# Patient Record
Sex: Female | Born: 1980 | Race: White | Hispanic: No | Marital: Married | Smoking: Never smoker
Health system: Southern US, Community
[De-identification: ages and names within clinical notes are randomized; demographics above are authoritative.]

## PROBLEM LIST (undated history)

## (undated) ENCOUNTER — Emergency Department (HOSPITAL_COMMUNITY): Admission: EM | Payer: Self-pay | Source: Home / Self Care

## (undated) ENCOUNTER — Inpatient Hospital Stay: Payer: Self-pay

## (undated) DIAGNOSIS — Q379 Unspecified cleft palate with unilateral cleft lip: Secondary | ICD-10-CM

## (undated) DIAGNOSIS — Z803 Family history of malignant neoplasm of breast: Secondary | ICD-10-CM

## (undated) DIAGNOSIS — R310 Gross hematuria: Secondary | ICD-10-CM

## (undated) DIAGNOSIS — D649 Anemia, unspecified: Secondary | ICD-10-CM

## (undated) DIAGNOSIS — N2889 Other specified disorders of kidney and ureter: Secondary | ICD-10-CM

## (undated) DIAGNOSIS — N3001 Acute cystitis with hematuria: Secondary | ICD-10-CM

## (undated) DIAGNOSIS — IMO0002 Reserved for concepts with insufficient information to code with codable children: Secondary | ICD-10-CM

## (undated) DIAGNOSIS — N1330 Unspecified hydronephrosis: Secondary | ICD-10-CM

## (undated) HISTORY — DX: Unspecified hydronephrosis: N13.30

## (undated) HISTORY — PX: KIDNEY SURGERY: SHX687

## (undated) HISTORY — DX: Gross hematuria: R31.0

## (undated) HISTORY — DX: Family history of malignant neoplasm of breast: Z80.3

## (undated) HISTORY — DX: Other specified disorders of kidney and ureter: N28.89

## (undated) HISTORY — DX: Reserved for concepts with insufficient information to code with codable children: IMO0002

## (undated) HISTORY — PX: CLEFT LIP REPAIR: SUR1164

## (undated) HISTORY — DX: Unspecified cleft palate with unilateral cleft lip: Q37.9

## (undated) HISTORY — PX: TUBAL LIGATION: SHX77

## (undated) HISTORY — DX: Acute cystitis with hematuria: N30.01

## (undated) HISTORY — PX: ECTOPIC PREGNANCY SURGERY: SHX613

---
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
## 2003-10-14 ENCOUNTER — Other Ambulatory Visit: Admission: RE | Admit: 2003-10-14 | Discharge: 2003-10-14 | Payer: Self-pay | Admitting: Obstetrics and Gynecology

## 2003-11-25 ENCOUNTER — Ambulatory Visit (HOSPITAL_COMMUNITY): Admission: RE | Admit: 2003-11-25 | Discharge: 2003-11-25 | Payer: Self-pay | Admitting: Obstetrics and Gynecology

## 2004-01-25 ENCOUNTER — Ambulatory Visit (HOSPITAL_COMMUNITY): Admission: RE | Admit: 2004-01-25 | Discharge: 2004-01-25 | Payer: Self-pay | Admitting: Obstetrics and Gynecology

## 2004-03-21 ENCOUNTER — Inpatient Hospital Stay (HOSPITAL_COMMUNITY): Admission: AD | Admit: 2004-03-21 | Discharge: 2004-03-21 | Payer: Self-pay | Admitting: Obstetrics and Gynecology

## 2004-04-25 ENCOUNTER — Inpatient Hospital Stay (HOSPITAL_COMMUNITY): Admission: AD | Admit: 2004-04-25 | Discharge: 2004-04-27 | Payer: Self-pay | Admitting: Internal Medicine

## 2010-06-12 ENCOUNTER — Emergency Department: Payer: Self-pay | Admitting: Emergency Medicine

## 2011-03-18 ENCOUNTER — Ambulatory Visit: Payer: Self-pay | Admitting: Obstetrics and Gynecology

## 2011-03-21 LAB — PATHOLOGY REPORT

## 2011-07-05 ENCOUNTER — Encounter: Payer: Self-pay | Admitting: Obstetrics & Gynecology

## 2011-11-15 ENCOUNTER — Observation Stay: Payer: Self-pay | Admitting: Obstetrics and Gynecology

## 2011-11-19 ENCOUNTER — Observation Stay: Payer: Self-pay | Admitting: Obstetrics and Gynecology

## 2011-11-20 ENCOUNTER — Observation Stay: Payer: Self-pay | Admitting: Obstetrics and Gynecology

## 2011-12-05 ENCOUNTER — Observation Stay: Payer: Self-pay | Admitting: Obstetrics and Gynecology

## 2011-12-10 ENCOUNTER — Observation Stay: Payer: Self-pay

## 2011-12-17 ENCOUNTER — Observation Stay: Payer: Self-pay

## 2011-12-19 ENCOUNTER — Inpatient Hospital Stay: Payer: Self-pay | Admitting: Obstetrics and Gynecology

## 2012-05-16 ENCOUNTER — Emergency Department: Payer: Self-pay | Admitting: *Deleted

## 2013-03-30 ENCOUNTER — Encounter: Payer: Self-pay | Admitting: Family Medicine

## 2013-04-23 ENCOUNTER — Encounter: Payer: Self-pay | Admitting: Family Medicine

## 2013-10-22 ENCOUNTER — Ambulatory Visit: Payer: Self-pay | Admitting: Otolaryngology

## 2014-09-21 ENCOUNTER — Emergency Department: Payer: Self-pay | Admitting: Emergency Medicine

## 2014-09-21 LAB — URINALYSIS, COMPLETE
Bacteria: NONE SEEN
Bilirubin,UR: NEGATIVE
Blood: NEGATIVE
Glucose,UR: NEGATIVE mg/dL (ref 0–75)
Hyaline Cast: 2
Ketone: NEGATIVE
Leukocyte Esterase: NEGATIVE
Nitrite: NEGATIVE
Ph: 8 (ref 4.5–8.0)
Protein: NEGATIVE
RBC,UR: NONE SEEN /HPF (ref 0–5)
Specific Gravity: 1.015 (ref 1.003–1.030)
Squamous Epithelial: 1
WBC UR: NONE SEEN /HPF (ref 0–5)

## 2014-09-21 LAB — HCG, QUANTITATIVE, PREGNANCY: Beta Hcg, Quant.: 31752 m[IU]/mL — ABNORMAL HIGH

## 2014-09-21 LAB — COMPREHENSIVE METABOLIC PANEL
Albumin: 3.5 g/dL (ref 3.4–5.0)
Alkaline Phosphatase: 74 U/L
Anion Gap: 7 (ref 7–16)
BUN: 9 mg/dL (ref 7–18)
Bilirubin,Total: 0.2 mg/dL (ref 0.2–1.0)
Calcium, Total: 8.5 mg/dL (ref 8.5–10.1)
Chloride: 106 mmol/L (ref 98–107)
Co2: 24 mmol/L (ref 21–32)
Creatinine: 0.77 mg/dL (ref 0.60–1.30)
EGFR (African American): >60
EGFR (Non-African Amer.): >60
Glucose: 86 mg/dL (ref 65–99)
Osmolality: 272 (ref 275–301)
Potassium: 3.8 mmol/L (ref 3.5–5.1)
SGOT(AST): 13 U/L — ABNORMAL LOW (ref 15–37)
SGPT (ALT): 17 U/L
Sodium: 137 mmol/L (ref 136–145)
Total Protein: 7.3 g/dL (ref 6.4–8.2)

## 2014-09-21 LAB — CBC
HCT: 36.4 % (ref 35.0–47.0)
HGB: 11.7 g/dL — ABNORMAL LOW (ref 12.0–16.0)
MCH: 26.5 pg (ref 26.0–34.0)
MCHC: 32.2 g/dL (ref 32.0–36.0)
MCV: 82 fL (ref 80–100)
Platelet: 198 10*3/uL (ref 150–440)
RBC: 4.42 10*6/uL (ref 3.80–5.20)
RDW: 16.5 % — ABNORMAL HIGH (ref 11.5–14.5)
WBC: 6.2 10*3/uL (ref 3.6–11.0)

## 2014-09-21 LAB — LIPASE, BLOOD: Lipase: 89 U/L (ref 73–393)

## 2014-10-27 LAB — OB RESULTS CONSOLE ANTIBODY SCREEN: Antibody Screen: NEGATIVE

## 2014-10-27 LAB — OB RESULTS CONSOLE RPR: RPR: NONREACTIVE

## 2014-10-27 LAB — OB RESULTS CONSOLE HIV ANTIBODY (ROUTINE TESTING): HIV: NONREACTIVE

## 2014-10-27 LAB — OB RESULTS CONSOLE ABO/RH: RH Type: POSITIVE

## 2014-10-27 LAB — OB RESULTS CONSOLE PLATELET COUNT: Platelets: 223 10*3/uL

## 2014-10-27 LAB — OB RESULTS CONSOLE VARICELLA ZOSTER ANTIBODY, IGG: Varicella: IMMUNE

## 2014-10-27 LAB — OB RESULTS CONSOLE RUBELLA ANTIBODY, IGM: Rubella: NON-IMMUNE/NOT IMMUNE

## 2014-10-27 LAB — OB RESULTS CONSOLE GC/CHLAMYDIA
Chlamydia: NEGATIVE
Gonorrhea: NEGATIVE

## 2014-10-27 LAB — OB RESULTS CONSOLE HGB/HCT, BLOOD
HCT: 35 %
Hemoglobin: 11.7 g/dL

## 2014-11-06 LAB — OB RESULTS CONSOLE GC/CHLAMYDIA
Chlamydia: NEGATIVE
Gonorrhea: NEGATIVE

## 2014-11-06 LAB — OB RESULTS CONSOLE HIV ANTIBODY (ROUTINE TESTING): HIV: NONREACTIVE

## 2014-11-06 LAB — OB RESULTS CONSOLE HEPATITIS B SURFACE ANTIGEN: Hepatitis B Surface Ag: NEGATIVE

## 2014-12-24 NOTE — L&D Delivery Note (Signed)
Obstetrical Delivery Note   Date of Delivery:   05/12/2015 Primary OB:   [facility] Gestational Age/EDD: [redacted]w[redacted]d  Antepartum complications: History of IUFD, right hydronephrosis  Delivered By:   Durene Romans MD  Delivery Type:   spontaneous vaginal delivery  Procedure Details:   CTSP and over one UC, patient delivered vigorous female infant, with nuchal cord x 1 reduced. Delayed CC and cord then cut. Placenta out (intact) via active third stage. Laceration repaired after injection of 1% lidocaine (90m) with 2-0 vicryl in the usual fashion Anesthesia:    epidural that wasn't working Intrapartum complications: None GBS:    Neg Laceration:    2nd without capsular involvement Episiotomy:    none Placenta:    Via active 3rd stage. To pathology: no Estimated Blood Loss:  262mL  Baby:    Liveborn female, Apgars 8/9, weight 3350gm  Durene Romans. MD [facility] Pager [phone]

## 2015-02-16 ENCOUNTER — Observation Stay: Payer: Self-pay | Admitting: Obstetrics and Gynecology

## 2015-03-03 ENCOUNTER — Ambulatory Visit: Payer: Self-pay | Admitting: Urology

## 2015-03-07 ENCOUNTER — Observation Stay: Payer: Self-pay

## 2015-03-08 ENCOUNTER — Emergency Department: Payer: Self-pay | Admitting: Obstetrics and Gynecology

## 2015-04-21 ENCOUNTER — Observation Stay
Admit: 2015-04-21 | Disposition: A | Discharge status: Home / Self Care | Payer: Self-pay | Attending: Obstetrics and Gynecology | Admitting: Obstetrics and Gynecology

## 2015-04-24 NOTE — Op Note (Signed)
   DATE OF PROCEDURE:   03/07/2015  PREOPERATIVE DIAGNOSIS: Right flank pain with hydronephrosis.   POSTOPERATIVE DIAGNOSIS:  Right flank pain with hydronephrosis with no evidence of calculi, but some tortuosity and dilatation of the right ureter which was corrected with the ureteroscopy.   PROCEDURE:  Cystoscopy, right ureteroscopy with a very short limited pyelogram.   DESCRIPTION OF PROCEDURE: With the patient sterilely prepped and draped and in the supine lithotomy position for ease of approach to the external genitalia, I began the procedure. Cystoscopy is done. No tumors, masses, growths, or stones in the bladder.  The right side ureter is instrumented with a 0.036 sensor wire.  I then took a rigid ureteroscope 7 [place] and placed it up the ureter cephalad direction without difficulty next to the wire.  There is no stone up to the UPJ, but there is tortuosity and the wire does not completely go into the renal pelvis.  So, I then decided to do a flexible ureteroscopy in case there is a calculus at the UPJ. I do a flexible ureteroscopy with a digital camera, self-contained, the Olympus flexible ureteroscope through a Navigator, which I place up over a second wire put in place with a double-lumen ureteral access catheter.  I then withdraw the second wire and leave the safety wire in place.  I then examined the ureter all the way to the tortuous UPJ area.  This drains right out with the use of the ureteroscope and I am looking completely throughout the renal pelvis and find no calculi anywhere in the renal pelvis or any of the multiple calyces that are explored with the flexible scope.  So, I withdraw the flexible scope with the ureteral access catheter and view the ureter again all along its length. I find no calculi.  Then I take the wire out and the patient is sent to recovery with an empty bladder in satisfactory condition.   ___________________________ Janice Coffin. Elnoria Howard, [place] rdh:sp D: 03/07/2015  10:50:00 ET T: 03/07/2015 11:04:40 ET JOB#: [id number]  cc: Janice Coffin. Elnoria Howard, DO, <Dictator> Beulah Matusek D Francys Bolin DO ELECTRONICALLY SIGNED 04/04/2015 12:57

## 2015-05-03 NOTE — H&P (Signed)
L&D Evaluation:  History Expanded:  HPI Pt is a 34 yo G5P2112 at 29.[redacted] weeks GA with an EDC of 05/19/15. Pt is s/p cystoscopy/right uretoscopy/limited pyelogram today per Dr Elnoria Howard. Postoperatively pt began feeling pain on right side radiating down to pelvis and in low abdomen. Pt received 1 Percocet 2 hours ago with little relief of pain. Denies LOF, VB or decreased FM.   PNC at [facility] with early entry to care, hx of IUFD at 44 weeks in the past. She is O+, VI, RNI. She was just seen in the office 2 days ago with no complaints.   Presents with vaginal bleeding   Patient's Medical History No Chronic Illness   Patient's Surgical History other  cleft lip/palate repair, left salpingectomy due to ectopic   Medications Pre Natal Vitamins   Allergies NKDA   Social History none   Family History Non-Contributory   ROS:  ROS All systems were reviewed.  HEENT, CNS, GI, GU, Respiratory, CV, Renal and Musculoskeletal systems were found to be normal.   Exam:  Vital Signs stable   General obviously uncomfortable   Mental Status clear   Abdomen gravid, soft to palpation   Pelvic FT/long/high   Mebranes Intact   FHT normal rate with no decels, category 1 for gestational age   Ucx absent   Skin dry, no lesions, no rashes   Lymph no lymphadenopathy   Impression:  Impression IUP at [redacted]w[redacted]d, s/p cystoscopy and rt uteroscopy and limited pyelogram with pain   Plan:  Comments Offered IV pain medication, which pt is initially resistant to d/t h/o IUFD following pain medication. Reviewed we will continue to monitor fetal well-being after pain medication, very reassuring fetal surveillance currently.   Spoke with Dr Elnoria Howard (pt's Urologist) who states pt may f/u with them if pain continues. Next step would possibly be Percutaneous Nephrostomy. Today's surgery did not show any significant reason for severe pain that pt is experiencing.   Electronic Signatures: Houda Brau, Rulon Sera (CNM)  (Signed  14-Mar-16 15:57)  Authored: L&D Evaluation   Last Updated: 14-Mar-16 15:57 by Ander Purpura (CNM)

## 2015-05-03 NOTE — H&P (Signed)
L&D Evaluation:  History:  HPI Pt is a 34 yo G5P2112 at 26.[redacted] weeks GA with an EDC of 05/19/15 with reports of vaginal bleeding. Pt reports that the bleeding started today when she wiped after going to the bathroom. She reports pink blood but red in the toilet. She reports +Fm, denies lof or ctx. She denies dysuria, fever, chills, nausea or vomiting. She does say that her back has been hurting on the right side more that normal over the last couple of days. Her prenatal course is significant for hx of IUFD at 35 weeks in the past. She is O+, VI, RNI. She was just seen in the office 2 days ago with no complaints.   Presents with vaginal bleeding   Patient's Medical History No Chronic Illness   Patient's Surgical History other  cleft lip/palate repair, left salpingectomy due to ectopic   Medications Pre Natal Vitamins   Allergies NKDA   Social History none   Family History Non-Contributory   ROS:  ROS All systems were reviewed.  HEENT, CNS, GI, GU, Respiratory, CV, Renal and Musculoskeletal systems were found to be normal.   Exam:  Vital Signs stable   General no apparent distress   Mental Status clear   Chest clear   Heart normal sinus rhythm   Abdomen gravid, non-tender   Back no CVAT, lower right sided back pain near her at the level of the sacrum and near the flank area   Pelvic cervix closed and thick, no blood seen on glove   Mebranes Intact   FHT normal rate with no decels, intermittent monitoring due to fetus constantly moving off the monitor. 135 with +accel seen   Ucx absent   Skin dry, no lesions, no rashes   Lymph no lymphadenopathy   Other UA with 4512 RBCs, 79 WBCs, no bacteria, undeterminable protein, leuks, or nitrities. SG- 1.015.  U/S of kidneys with moderate to severe right hydronephrosis and pelvicaliectasis with no obstruction seen currently.   Impression:  Impression IUP at [redacted]w[redacted]d, R/O UTI vs kidney stone, hematuria, no evidence of labor    Plan:  Plan discharge   Comments Plan discussed with Dr. Glennon Mac.  Will get urine culture.  Refer through the office to urology as well as f/u in office in the next 24-48 hours.  Discussed with pt to call if not voiding, increased pain or fever.   Follow Up Appointment need to schedule. 1-2 days   Electronic Signatures: Destanie Tibbetts, Loma Sousa (CNM)  (Signed 25-Feb-16 00:17)  Authored: L&D Evaluation   Last Updated: 25-Feb-16 00:17 by Louisa Second (CNM)

## 2015-05-03 NOTE — H&P (Signed)
L&D Evaluation:  History:  HPI Pt is a 34 yo G5P2112 at 29.[redacted] weeks GA with an EDC of 05/19/15 at [redacted]w[redacted]d with two potentially subtle decelerations noted on office NST.  Patient feels well, reports positive fetal movement, no contractions. no LOF.  PNC at [facility] with early entry to care, hx of IUFD at 60 weeks in the past. She is O+, VI, RNI. She was just seen in the office 2 days ago with no complaints.   Presents with prolonged fetal monitoring   Patient's Medical History No Chronic Illness   Patient's Surgical History other  cleft lip/palate repair, left salpingectomy due to ectopic   Medications Pre Natal Vitamins   Allergies NKDA   Social History none   Family History Non-Contributory   ROS:  ROS All systems were reviewed.  HEENT, CNS, GI, GU, Respiratory, CV, Renal and Musculoskeletal systems were found to be normal.   Exam:  Vital Signs stable   Urine Protein 3+   Mental Status clear   Abdomen gravid, soft to palpation   Estimated Fetal Weight Average for gestational age   Fetal Position vtx   FHT normal rate with no decels, 135, moderate, positive accelerations, no decelerations - category I tracing   Ucx absent   Skin dry, no lesions, no rashes   Lymph no lymphadenopathy   Other AFI reviewed and noted to be 10.4cm   Impression:  Impression [redacted]w[redacted]d with reassuring fetal surveillance on prolonged monitoring   Plan:  Comments - reactive NST/category I tracing throughout monitoring here on L&D (1.5hrs) - normal AFI - patient reassured follow up in the next week   Electronic Signatures: Dorthula Nettles (MD)  (Signed 28-Apr-16 14:55)  Authored: L&D Evaluation   Last Updated: 28-Apr-16 14:55 by Dorthula Nettles (MD)

## 2015-05-07 ENCOUNTER — Observation Stay
Admission: EM | Admit: 2015-05-07 | Discharge: 2015-05-07 | Disposition: A | Discharge status: Home / Self Care | Payer: Medicaid Other | Attending: Obstetrics and Gynecology | Admitting: Obstetrics and Gynecology

## 2015-05-07 DIAGNOSIS — O368130 Decreased fetal movements, third trimester, not applicable or unspecified: Principal | ICD-10-CM | POA: Insufficient documentation

## 2015-05-07 DIAGNOSIS — Z3A38 38 weeks gestation of pregnancy: Secondary | ICD-10-CM | POA: Insufficient documentation

## 2015-05-07 DIAGNOSIS — O368190 Decreased fetal movements, unspecified trimester, not applicable or unspecified: Secondary | ICD-10-CM | POA: Diagnosis present

## 2015-05-07 HISTORY — DX: Anemia, unspecified: D64.9

## 2015-05-07 NOTE — Progress Notes (Signed)
S: 34 yo G5 P2112 with EDD of 05/19/15 presents at [redacted]w[redacted]d with c/o decreased FM today. Denies regular contractions, LOF, VB or other concerns. Noted increased FM after EFM applied  O: FHR: category 1 fetal tracing with baseline 130, mod variability, + accels, no decels, rare ctx.       VSS  A: IUP at [redacted]w[redacted]d with decreased FM, category 1 tracing   P: Discharge home after 1 hour NST Labor precautions Has appt at office on 05/09/15

## 2015-05-07 NOTE — OB Triage Note (Signed)
34 yo Caucasian female presents today with complaint of decreased fetal movement since 1100 this morning.  Tried to eat and lay down at home and fetal movement did not increase.  Denies any bleeding, ctx, or leakage of fluid.

## 2015-05-11 ENCOUNTER — Inpatient Hospital Stay
Admission: EM | Admit: 2015-05-11 | Discharge: 2015-05-14 | DRG: 775 | Disposition: A | Discharge status: Home / Self Care | Payer: Medicaid Other | Attending: Obstetrics & Gynecology | Admitting: Obstetrics & Gynecology

## 2015-05-11 ENCOUNTER — Encounter: Payer: Self-pay | Admitting: Certified Nurse Midwife

## 2015-05-11 DIAGNOSIS — Z3A39 39 weeks gestation of pregnancy: Secondary | ICD-10-CM | POA: Diagnosis present

## 2015-05-11 DIAGNOSIS — Z87730 Personal history of (corrected) cleft lip and palate: Secondary | ICD-10-CM

## 2015-05-11 DIAGNOSIS — D62 Acute posthemorrhagic anemia: Secondary | ICD-10-CM | POA: Diagnosis present

## 2015-05-11 DIAGNOSIS — N1330 Unspecified hydronephrosis: Secondary | ICD-10-CM | POA: Diagnosis present

## 2015-05-11 DIAGNOSIS — O26899 Other specified pregnancy related conditions, unspecified trimester: Secondary | ICD-10-CM | POA: Diagnosis present

## 2015-05-11 DIAGNOSIS — Z9889 Other specified postprocedural states: Secondary | ICD-10-CM

## 2015-05-11 DIAGNOSIS — Z809 Family history of malignant neoplasm, unspecified: Secondary | ICD-10-CM

## 2015-05-11 LAB — OB RESULTS CONSOLE GBS: STREP GROUP B AG: NEGATIVE

## 2015-05-11 MED ORDER — TERBUTALINE SULFATE 1 MG/ML IJ SOLN: 0.2500 mg | Freq: Once | INTRAMUSCULAR | Status: AC | PRN

## 2015-05-11 MED ORDER — MISOPROSTOL 25 MCG QUARTER TABLET
25.0000 ug | ORAL_TABLET | ORAL | Status: DC | PRN
  Administered 2015-05-11 – 2015-05-12 (×2): 25 ug via ORAL
  Filled 2015-05-11: qty 1

## 2015-05-11 MED ORDER — ZOLPIDEM TARTRATE 5 MG PO TABS
5.0000 mg | ORAL_TABLET | Freq: Every evening | ORAL | Status: AC | PRN
  Administered 2015-05-12: 5 mg via ORAL

## 2015-05-11 MED ORDER — MISOPROSTOL 25 MCG QUARTER TABLET: 25.0000 ug | ORAL_TABLET | ORAL | Status: DC | PRN

## 2015-05-11 MED ORDER — MISOPROSTOL 25 MCG QUARTER TABLET
ORAL_TABLET | ORAL | Status: AC
  Administered 2015-05-11: 25 ug via ORAL
  Filled 2015-05-11: qty 0.25

## 2015-05-11 MED ORDER — BUTORPHANOL TARTRATE 1 MG/ML IJ SOLN: 1.0000 mg | INTRAMUSCULAR | Status: DC | PRN

## 2015-05-11 NOTE — H&P (Signed)
Obstetric History and Physical  Katie Villa is a 34 y.o. [id number] with IUP at [redacted]w[redacted]d presenting for IOL for history of IUFD in 2005 at 35-36 weeks. Patient states she has been having  irregular contractions at home, none vaginal bleeding, intact membranes, with active fetal movement.    Prenatal Course Source of Care: [facility]  with onset of care at 8 weeks Pregnancy complications or risks: Pt with hx of IUFD in 2005 at 35-36 weeks. Pt also with right hydronephrosis in pregnancy requiring surgery, and anemia.  Patient Active Problem List   Diagnosis Date Noted  . Decreased fetal movement 05/07/2015   She plans to breastfeed She desires bilateral tubal ligation for postpartum contraception.   Prenatal labs and studies: ABO, Rh: O/Positive/-- (11/04 0000) Antibody: Negative (11/04 0000) Rubella: Nonimmune (11/04 0000) Varicella: Immune RPR: Nonreactive (11/04 0000)  HBsAg: Negative (11/14 0000)  HIV: Non-reactive (11/14 0000)  SVX:BLTJQZES (05/18 0000) 1 hr Glucola  86 Genetic screening normal Anatomy US normal Tdap: 03/14/15 Flu: 12/06/14   Past Medical History  Diagnosis Date  . Anemia     Past Surgical History  Procedure Laterality Date  . Kidney surgery    . Cleft lip repair    . Ectopic pregnancy surgery      OB History  Gravida Para Term Preterm AB SAB TAB Ectopic Multiple Living  5 3 2 1 1   1  2     # Outcome Date GA Lbr Len/2nd Weight Sex Delivery Anes PTL Lv  5 Current           4 Ectopic           3 Preterm           2 Term           1 Term               History   Social History  . Marital Status: Married    Spouse Name: N/A  . Number of Children: N/A  . Years of Education: N/A   Social History Main Topics  . Smoking status: Never Smoker   . Smokeless tobacco: Not on file  . Alcohol Use: No  . Drug Use: No  . Sexual Activity: Yes    Birth Control/ Protection: None   Other Topics Concern  . None   Social History Narrative    Family  History  Problem Relation Age of Onset  . Cancer Mother   . Cancer Maternal Aunt     Prescriptions prior to admission  Medication Sig Dispense Refill Last Dose  . Iron-FA-B Cmp-C-Biot-Probiotic (FUSION PLUS PO) Take 1 capsule by mouth daily.   05/11/2015 at Unknown time  . Prenatal Vit-Fe Fumarate-FA (PRENATAL MULTIVITAMIN) TABS tablet Take 1 tablet by mouth daily at 12 noon.   05/11/2015 at Unknown time    No Known Allergies  Review of Systems: Negative except for what is mentioned in HPI.  Physical Exam: Temp(Src) 98.4 F (36.9 C) (Oral)  Resp 20  LMP 08/12/2014 GENERAL: Well-developed, well-nourished female in no acute distress.  LUNGS: Clear to auscultation bilaterally.  HEART: Regular rate and rhythm. ABDOMEN: Soft, nontender, nondistended, gravid. EXTREMITIES: Nontender, no edema, 2+ distal pulses. Dilation: 3 Effacement (%): 60 Cervical Position: Middle Station: -2 Presentation: Vertex Exam by:: midwife Presentation: cephalic FHT:  Baseline rate ranges from 135-150s bpm   Variability moderate  Accelerations present   Decelerations variable decelerations seen sporadically with prompt return to baseline  Contractions: Not observed  currently   Pertinent Labs/Studies:   Results for orders placed or performed during the hospital encounter of 05/11/15 (from the past 24 hour(s))  OB RESULT CONSOLE Group B Strep     Status: None   Collection Time: 05/11/15 12:00 AM  Result Value Ref Range   GBS Negative     Assessment : Katie Villa is a 34 y.o. [id number] at [redacted]w[redacted]d being admitted for IOL for history of IUFD.   Plan: Labor: IOL orders in for cytotec- will proceed with 77mcg SL Q 4 hours. Consider pitocin in the am for augmentation if needed for IOL.    FWB: Reassuring fetal heart tracing.  GBS negative Delivery plan: Hopeful for vaginal delivery  Louisa Second, [facility]

## 2015-05-12 ENCOUNTER — Inpatient Hospital Stay: Payer: Medicaid Other | Admitting: Anesthesiology

## 2015-05-12 ENCOUNTER — Encounter: Payer: Self-pay | Admitting: *Deleted

## 2015-05-12 DIAGNOSIS — O26899 Other specified pregnancy related conditions, unspecified trimester: Secondary | ICD-10-CM | POA: Diagnosis present

## 2015-05-12 DIAGNOSIS — N1330 Unspecified hydronephrosis: Secondary | ICD-10-CM | POA: Diagnosis present

## 2015-05-12 DIAGNOSIS — Z87730 Personal history of (corrected) cleft lip and palate: Secondary | ICD-10-CM | POA: Diagnosis not present

## 2015-05-12 DIAGNOSIS — D62 Acute posthemorrhagic anemia: Secondary | ICD-10-CM | POA: Diagnosis present

## 2015-05-12 DIAGNOSIS — Z3A39 39 weeks gestation of pregnancy: Secondary | ICD-10-CM | POA: Diagnosis present

## 2015-05-12 DIAGNOSIS — Z9889 Other specified postprocedural states: Secondary | ICD-10-CM | POA: Diagnosis present

## 2015-05-12 DIAGNOSIS — Z809 Family history of malignant neoplasm, unspecified: Secondary | ICD-10-CM | POA: Diagnosis not present

## 2015-05-12 LAB — TYPE AND SCREEN
ABO/RH(D): O POS
ANTIBODY SCREEN: NEGATIVE

## 2015-05-12 LAB — CBC
HCT: 30.8 % — ABNORMAL LOW (ref 35.0–47.0)
Hemoglobin: 10.1 g/dL — ABNORMAL LOW (ref 12.0–16.0)
MCH: 27.9 pg (ref 26.0–34.0)
MCHC: 32.8 g/dL (ref 32.0–36.0)
MCV: 84.9 fL (ref 80.0–100.0)
PLATELETS: 197 10*3/uL (ref 150–440)
RBC: 3.62 MIL/uL — ABNORMAL LOW (ref 3.80–5.20)
RDW: 19.3 % — ABNORMAL HIGH (ref 11.5–14.5)
WBC: 9.9 10*3/uL (ref 3.6–11.0)

## 2015-05-12 LAB — PLATELET COUNT: Platelets: 190 10*3/uL (ref 150–440)

## 2015-05-12 MED ORDER — LACTATED RINGERS IV SOLN: 500.0000 mL | INTRAVENOUS | Status: DC | PRN

## 2015-05-12 MED ORDER — OXYCODONE-ACETAMINOPHEN 5-325 MG PO TABS
1.0000 | ORAL_TABLET | ORAL | Status: DC | PRN
  Administered 2015-05-12: 1 via ORAL
  Filled 2015-05-12: qty 1

## 2015-05-12 MED ORDER — SIMETHICONE 80 MG PO CHEW: 80.0000 mg | CHEWABLE_TABLET | ORAL | Status: DC | PRN

## 2015-05-12 MED ORDER — OXYCODONE-ACETAMINOPHEN 5-325 MG PO TABS: 2.0000 | ORAL_TABLET | ORAL | Status: DC | PRN

## 2015-05-12 MED ORDER — LANOLIN HYDROUS EX OINT
TOPICAL_OINTMENT | CUTANEOUS | Status: DC | PRN
Start: 2015-05-12 — End: 2015-05-14

## 2015-05-12 MED ORDER — OXYCODONE-ACETAMINOPHEN 5-325 MG PO TABS: 1.0000 | ORAL_TABLET | ORAL | Status: DC | PRN

## 2015-05-12 MED ORDER — LIDOCAINE HCL (PF) 1 % IJ SOLN
30.0000 mL | INTRAMUSCULAR | Status: AC | PRN
  Administered 2015-05-12: 3 mL via SUBCUTANEOUS

## 2015-05-12 MED ORDER — MAGNESIUM HYDROXIDE 400 MG/5ML PO SUSP: 30.0000 mL | ORAL | Status: DC | PRN

## 2015-05-12 MED ORDER — WITCH HAZEL-GLYCERIN EX PADS: 1.0000 "application " | MEDICATED_PAD | CUTANEOUS | Status: DC | PRN

## 2015-05-12 MED ORDER — BENZOCAINE-MENTHOL 20-0.5 % EX AERO
1.0000 "application " | INHALATION_SPRAY | CUTANEOUS | Status: DC | PRN
  Administered 2015-05-13: 1 via TOPICAL
  Filled 2015-05-12: qty 56

## 2015-05-12 MED ORDER — OXYTOCIN BOLUS FROM INFUSION
500.0000 mL | INTRAVENOUS | Status: DC
  Administered 2015-05-12: 500 mL via INTRAVENOUS

## 2015-05-12 MED ORDER — ACETAMINOPHEN 325 MG PO TABS: 650.0000 mg | ORAL_TABLET | ORAL | Status: DC | PRN

## 2015-05-12 MED ORDER — ONDANSETRON HCL 4 MG/2ML IJ SOLN: 4.0000 mg | INTRAMUSCULAR | Status: DC | PRN

## 2015-05-12 MED ORDER — OXYTOCIN 40 UNITS IN LACTATED RINGERS INFUSION - SIMPLE MED
1.0000 m[IU]/min | INTRAVENOUS | Status: DC
  Administered 2015-05-12: 1 m[IU]/min via INTRAVENOUS

## 2015-05-12 MED ORDER — MEASLES, MUMPS & RUBELLA VAC ~~LOC~~ INJ
0.5000 mL | INJECTION | Freq: Once | SUBCUTANEOUS | Status: DC
  Filled 2015-05-12: qty 0.5

## 2015-05-12 MED ORDER — LIDOCAINE-EPINEPHRINE (PF) 1.5 %-1:200000 IJ SOLN
INTRAMUSCULAR | Status: DC | PRN
  Administered 2015-05-12: 3 mL

## 2015-05-12 MED ORDER — ZOLPIDEM TARTRATE 5 MG PO TABS
ORAL_TABLET | ORAL | Status: AC
  Administered 2015-05-12: 5 mg via ORAL
  Filled 2015-05-12: qty 1

## 2015-05-12 MED ORDER — DIPHENHYDRAMINE HCL 25 MG PO CAPS: 25.0000 mg | ORAL_CAPSULE | Freq: Four times a day (QID) | ORAL | Status: DC | PRN

## 2015-05-12 MED ORDER — OXYTOCIN 40 UNITS IN LACTATED RINGERS INFUSION - SIMPLE MED: 62.5000 mL/h | INTRAVENOUS | Status: DC

## 2015-05-12 MED ORDER — BUTORPHANOL TARTRATE 1 MG/ML IJ SOLN
2.0000 mg | Freq: Once | INTRAMUSCULAR | Status: DC
  Administered 2015-05-12: 0.5 mg via INTRAVENOUS

## 2015-05-12 MED ORDER — BUPIVACAINE HCL (PF) 0.25 % IJ SOLN
INTRAMUSCULAR | Status: DC | PRN
  Administered 2015-05-12 (×2): 5 mL

## 2015-05-12 MED ORDER — PRENATAL MULTIVITAMIN CH
1.0000 | ORAL_TABLET | Freq: Every day | ORAL | Status: DC
  Administered 2015-05-13 – 2015-05-14 (×2): 1 via ORAL
  Filled 2015-05-12 (×2): qty 1

## 2015-05-12 MED ORDER — LACTATED RINGERS IV SOLN: INTRAVENOUS | Status: DC

## 2015-05-12 MED ORDER — MISOPROSTOL 25 MCG QUARTER TABLET
ORAL_TABLET | ORAL | Status: AC
  Administered 2015-05-12: 25 ug via ORAL
  Filled 2015-05-12: qty 0.25

## 2015-05-12 MED ORDER — IBUPROFEN 600 MG PO TABS
600.0000 mg | ORAL_TABLET | Freq: Four times a day (QID) | ORAL | Status: DC
  Administered 2015-05-12 – 2015-05-14 (×7): 600 mg via ORAL
  Filled 2015-05-12 (×9): qty 1

## 2015-05-12 MED ORDER — OXYTOCIN 40 UNITS IN LACTATED RINGERS INFUSION - SIMPLE MED
62.5000 mL/h | INTRAVENOUS | Status: DC | PRN
  Administered 2015-05-12: 62.5 mL/h via INTRAVENOUS
  Filled 2015-05-12: qty 1000

## 2015-05-12 MED ORDER — DIBUCAINE 1 % RE OINT: 1.0000 "application " | TOPICAL_OINTMENT | RECTAL | Status: DC | PRN

## 2015-05-12 MED ORDER — TERBUTALINE SULFATE 1 MG/ML IJ SOLN
0.2500 mg | Freq: Once | INTRAMUSCULAR | Status: DC | PRN
  Filled 2015-05-12: qty 1

## 2015-05-12 MED ORDER — ONDANSETRON HCL 4 MG PO TABS: 4.0000 mg | ORAL_TABLET | ORAL | Status: DC | PRN

## 2015-05-12 MED ORDER — LACTATED RINGERS IV SOLN
INTRAVENOUS | Status: DC
  Administered 2015-05-12: 13:00:00 via INTRAVENOUS

## 2015-05-12 MED ORDER — OXYTOCIN 40 UNITS IN LACTATED RINGERS INFUSION - SIMPLE MED
INTRAVENOUS | Status: AC
  Administered 2015-05-12: 1 m[IU]/min via INTRAVENOUS
  Filled 2015-05-12: qty 1000

## 2015-05-12 MED ORDER — ONDANSETRON HCL 4 MG/2ML IJ SOLN: 4.0000 mg | Freq: Four times a day (QID) | INTRAMUSCULAR | Status: DC | PRN

## 2015-05-12 MED ORDER — FENTANYL 2.5 MCG/ML W/ROPIVACAINE 0.2% IN NS 100 ML EPIDURAL INFUSION (ARMC-ANES)
EPIDURAL | Status: AC
  Administered 2015-05-12: 9 mL/h via EPIDURAL
  Filled 2015-05-12: qty 100

## 2015-05-12 MED ORDER — BUTORPHANOL TARTRATE 1 MG/ML IJ SOLN
INTRAMUSCULAR | Status: AC
  Administered 2015-05-12: 0.5 mg via INTRAVENOUS
  Filled 2015-05-12: qty 2

## 2015-05-12 NOTE — Anesthesia Procedure Notes (Signed)
Epidural Patient location during procedure: OB Start time: 05/12/2015 3:05 PM  Staffing Resident/CRNA: Yaniel Limbaugh Performed by: resident/CRNA   Preanesthetic Checklist Completed: patient identified, surgical consent, pre-op evaluation, timeout performed, IV checked, risks and benefits discussed and monitors and equipment checked  Epidural Patient position: sitting Prep: Betadine Patient monitoring: heart rate, continuous pulse ox and blood pressure Approach: midline Location: L3-L4 Injection technique: LOR saline  Needle:  Needle type: Tuohy  Needle gauge: 18 G Needle length: 9 cm Needle insertion depth: 7 cm Catheter type: closed end flexible Catheter size: 20 Guage Catheter at skin depth: 12 cm Test dose: negative and 1.5% lidocaine with Epi 1:200 K  Assessment Events: blood not aspirated, injection not painful, no injection resistance, negative IV test and no paresthesia  Additional Notes Reason for block:procedure for pain

## 2015-05-12 NOTE — Plan of Care (Signed)
Problem: Consults Goal: Birthing Suites Patient Information Press F2 to bring up selections list   Pt 37-[redacted] weeks EGA     

## 2015-05-12 NOTE — Anesthesia Preprocedure Evaluation (Signed)
Anesthesia Evaluation  Patient identified by MRN, date of birth, ID band Patient awake    Reviewed: Allergy & Precautions, H&P , NPO status , Patient's Chart, lab work & pertinent test results  Airway Mallampati: II       Dental no notable dental hx.    Pulmonary neg pulmonary ROS,          Cardiovascular negative cardio ROS Normal cardiovascular exam    Neuro/Psych    GI/Hepatic negative GI ROS, Neg liver ROS,   Endo/Other    Renal/GU negative Renal ROS     Musculoskeletal   Abdominal   Peds  Hematology negative hematology ROS (+)   Anesthesia Other Findings   Reproductive/Obstetrics (+) Pregnancy                             Anesthesia Physical Anesthesia Plan  ASA: II  Anesthesia Plan: Epidural   Post-op Pain Management:    Induction:   Airway Management Planned:   Additional Equipment:   Intra-op Plan:   Post-operative Plan:   Informed Consent: I have reviewed the patients History and Physical, chart, labs and discussed the procedure including the risks, benefits and alternatives for the proposed anesthesia with the patient or authorized representative who has indicated his/her understanding and acceptance.     Plan Discussed with: Anesthesiologist  Anesthesia Plan Comments:         Anesthesia Quick Evaluation

## 2015-05-12 NOTE — Progress Notes (Signed)
L&D Note  05/12/2015 - 8:29 AM  34 y.o. [id number] [redacted]w[redacted]d   Katie Villa is admitted for IOL for h/o IUFD   Subjective:  Feeling some ctx  Objective:   Filed Vitals:   05/11/15 2308 05/12/15 0358 05/12/15 0751 05/12/15 0752  BP:    127/78  Pulse:    91  Temp:  97.9 F (36.6 C) 97.8 F (36.6 C)   TempSrc:  Oral Oral   Resp: 20 18 20    Height: 5\' 10"  (1.778 m)   5\' 10"  (1.778 m)  Weight: 109.317 kg (241 lb)   109.317 kg (241 lb)    Current Vital Signs 24h Vital Sign Ranges  T 97.8 F (36.6 C) Temp  Avg: 98 F (36.7 C)  Min: 97.8 F (36.6 C)  Max: 98.4 F (36.9 C)  BP 127/78 mmHg BP  Min: 127/78  Max: 127/78  HR 91 Pulse  Avg: 91  Min: 91  Max: 91  RR 20 Resp  Avg: 18.8  Min: 18  Max: 20  SaO2     No Data Recorded       24 Hour I/O Current Shift I/O  Time Ins Outs        FHR: baseline 130, mod variability, + accels, no decels Toco: irregular ctx SVE: 3-4/60/-2   Assessment :  IUP at 39 wks, IOL    Plan:  Begin pitocin induction. Reviewed risks of IOL including FITL, pt in agreement to plan Epidural when indicated  Katie Villa, [place]

## 2015-05-12 NOTE — Discharge Summary (Signed)
Obstetrical Discharge Summary  Date of Admission: 05/11/2015 Date of Discharge: 05/12/2015  Primary OB: [facility]  Gestational Age at Delivery: [redacted]w[redacted]d   Antepartum complications: history of IUFD, right hydronephrosis Reason for admission: Induction Date of Delivery: 05/12/2015  Delivered By: Durene Romans. MD Delivery Type: spontaneous vaginal delivery Intrapartum complications/course: None Anesthesia: epidural (not working) Placenta: via active third stage Laceration: 2nd degree with capsular involvement Episiotomy: none Baby: Liveborn female, APGARS  8/9, weight 3350 g.   Post partum course: uneventful, normal post partum recovery Disposition: home with newborn  Rh Immune globulin given: not applicable Rubella vaccine given: yes Tdap vaccine given in AP or PP setting: yes  Contraception: interval tubal ligation  Prenatal Labs: O POS//Rubella Not immune//RPR negative//HIV negative/HepB Surface Ag negative//pap and HPV neg (date: 2015)//plans to breastfeed//female  Plan:  Katie Villa was discharged to home in good condition. Follow-up appointment with Dr. Ilda Basset in 4 weeks  No future appointments.  Discharge Medications:   Medication List   tylenol, motrin, colace  ASK your doctor about these medications        FUSION PLUS PO  Take 1 capsule by mouth daily.     prenatal multivitamin Tabs tablet  Take 1 tablet by mouth daily at 12 noon.

## 2015-05-12 NOTE — Plan of Care (Signed)
Problem: Consults Goal: Birthing Suites Patient Information Press F2 to bring up selections list  Outcome: Completed/Met Date Met:  05/12/15  Pt 37-[redacted] weeks EGA and Inpatient induction  Problem: Phase I Progression Outcomes Goal: Pain controlled with appropriate interventions Outcome: Not Met (add Reason) Epidural did not work

## 2015-05-12 NOTE — Progress Notes (Signed)
L&D Note  05/12/2015 - 12:29 PM  34 y.o. [id number] [redacted]w[redacted]d   [name] is admitted for H/o IUFD   Subjective:  Feeling some discomfort from contractions, not regular  Objective:   Filed Vitals:   05/12/15 0936 05/12/15 1033 05/12/15 1135 05/12/15 1138  BP: 110/74 120/87  123/78  Pulse: 89 76  68  Temp:   98.1 F (36.7 C)   TempSrc:   Oral   Resp: 18 18 18 18   Height:      Weight:        Current Vital Signs 24h Vital Sign Ranges  T 98.1 F (36.7 C) Temp  Avg: 98.1 F (36.7 C)  Min: 97.8 F (36.6 C)  Max: 98.4 F (36.9 C)  BP 123/78 mmHg BP  Min: 110/74  Max: 127/78  HR 68 Pulse  Avg: 81  Min: 68  Max: 91  RR 18 Resp  Avg: 18.4  Min: 18  Max: 20  SaO2     No Data Recorded       24 Hour I/O Current Shift I/O  Time Ins Outs        FHR: baseline 130, mod variability, + accels, no decels Toco: q 5-6 per toco SVE: 5/60/-2   Assessment :  IUP at 39 wks, IOL  Plan:  AROM with clear, blood-tinged fluid noted Epidural when desired Continue Pitocin titration  Burlene Arnt, [place]

## 2015-05-13 ENCOUNTER — Encounter
Admission: EM | Disposition: A | Discharge status: Home / Self Care | Payer: Self-pay | Source: Home / Self Care | Attending: Obstetrics & Gynecology

## 2015-05-13 LAB — RPR: RPR Ser Ql: NONREACTIVE

## 2015-05-13 LAB — CBC
HEMATOCRIT: 28.7 % — AB (ref 35.0–47.0)
Hemoglobin: 9.6 g/dL — ABNORMAL LOW (ref 12.0–16.0)
MCH: 28.2 pg (ref 26.0–34.0)
MCHC: 33.5 g/dL (ref 32.0–36.0)
MCV: 84.3 fL (ref 80.0–100.0)
Platelets: 157 10*3/uL (ref 150–440)
RBC: 3.41 MIL/uL — ABNORMAL LOW (ref 3.80–5.20)
RDW: 18.5 % — ABNORMAL HIGH (ref 11.5–14.5)
WBC: 8.3 10*3/uL (ref 3.6–11.0)

## 2015-05-13 LAB — ABO/RH: ABO/RH(D): O POS

## 2015-05-13 SURGERY — LIGATION, FALLOPIAN TUBE, POSTPARTUM
Anesthesia: Choice | Laterality: Bilateral

## 2015-05-13 SURGERY — LIGATION, FALLOPIAN TUBE, BILATERAL
Anesthesia: Choice | Laterality: Bilateral

## 2015-05-13 NOTE — Progress Notes (Signed)
  Subjective:  Doing well, moderate lochia, no probelms  Objective:   Blood pressure 109/65, pulse 68, temperature 97.8 F (36.6 C), temperature source Oral, resp. rate 20, height 5\' 10"  (1.778 m), weight 109.317 kg (241 lb), last menstrual period 08/12/2014, SpO2 100 %, unknown if currently breastfeeding.  General: NAD Pulmonary: no increased work of breathing Abdomen: non-distended, non-tender, fundus firm at level of umbilicus Extremities: no edema, no erythema, no tenderness  Results for orders placed or performed during the hospital encounter of 05/11/15 (from the past 72 hour(s))  OB RESULT CONSOLE Group B Strep     Status: None   Collection Time: 05/11/15 12:00 AM  Result Value Ref Range   GBS Negative   CBC     Status: Abnormal   Collection Time: 05/11/15  8:05 PM  Result Value Ref Range   WBC 9.9 3.6 - 11.0 K/uL   RBC 3.62 (L) 3.80 - 5.20 MIL/uL   Hemoglobin 10.1 (L) 12.0 - 16.0 g/dL   HCT 30.8 (L) 35.0 - 47.0 %   MCV 84.9 80.0 - 100.0 fL   MCH 27.9 26.0 - 34.0 pg   MCHC 32.8 32.0 - 36.0 g/dL   RDW 19.3 (H) 11.5 - 14.5 %   Platelets 197 150 - 440 K/uL  Type and screen     Status: None   Collection Time: 05/11/15  8:05 PM  Result Value Ref Range   ABO/RH(D) O POS    Antibody Screen NEG    Sample Expiration 05/14/2015   RPR     Status: None   Collection Time: 05/11/15  8:05 PM  Result Value Ref Range   RPR Ser Ql Non Reactive Non Reactive    Comment: (NOTE) Performed At: [facility] [place], [place] [id number] Lindon Romp MD IN:[id number]   Platelet count     Status: None   Collection Time: 05/12/15  2:20 PM  Result Value Ref Range   Platelets 190 150 - 440 K/uL    Assessment:   34 y.o. [id number] postpartum day # 1  Plan:    1) Acute blood loss anemia - hemodynamically stable and asymptomatic will recheck CBC today - po ferrous sulfate  2) --/--/O POS (05/18 2005) / Nonimmune (11/04 0000) / Varicella Immune  3) TDAP  status 03/14/15  4) Breas/Contraception BTL elects for interval after counseling regarding possibility of scar tissue from prior ectopic  5) Disposition D/C PPD#2 with follow up in 4 weeks and interval BTL with myself (already sent scheduling sheet through [facility])

## 2015-05-13 NOTE — Anesthesia Postprocedure Evaluation (Signed)
  Anesthesia Post-op Note  Patient: Katie Villa  Procedure(s) Performed: * No procedures listed *  Anesthesia type:Epidural  Patient location: 337  Post pain: Pain level controlled  Post assessment: Post-op Vital signs reviewed, Patient's Cardiovascular Status Stable, Respiratory Function Stable, Patent Airway and No signs of Nausea or vomiting  Post vital signs: Reviewed and stable  Last Vitals:  Filed Vitals:   05/13/15 0717  BP: 109/65  Pulse: 68  Temp: 36.6 C  Resp: 20    Level of consciousness: awake, alert  and patient cooperative  Complications: No apparent anesthesia complications

## 2015-05-14 MED ORDER — ACETAMINOPHEN 325 MG PO TABS: 650.0000 mg | ORAL_TABLET | ORAL | Status: DC | PRN

## 2015-05-14 MED ORDER — IBUPROFEN 600 MG PO TABS: 600.0000 mg | ORAL_TABLET | Freq: Four times a day (QID) | ORAL | Status: DC

## 2015-05-14 MED ORDER — BENZOCAINE-MENTHOL 20-0.5 % EX AERO: 1.0000 "application " | INHALATION_SPRAY | CUTANEOUS | Status: DC | PRN

## 2015-05-14 MED ORDER — LANOLIN HYDROUS EX OINT: 1.0000 "application " | TOPICAL_OINTMENT | CUTANEOUS | Status: DC | PRN

## 2015-05-14 NOTE — Progress Notes (Signed)
Patient d/c home with infant

## 2015-06-15 ENCOUNTER — Encounter: Payer: Self-pay | Admitting: *Deleted

## 2015-06-15 ENCOUNTER — Other Ambulatory Visit: Payer: Medicaid Other

## 2015-06-15 DIAGNOSIS — Z302 Encounter for sterilization: Secondary | ICD-10-CM | POA: Diagnosis present

## 2015-06-15 DIAGNOSIS — K429 Umbilical hernia without obstruction or gangrene: Secondary | ICD-10-CM | POA: Diagnosis not present

## 2015-06-15 NOTE — Patient Instructions (Signed)
  Your procedure is scheduled on: 06/21/15 Report to Day Surgery. To find out your arrival time please call [phone] between 1PM - 3PM on 06/20/15.  Remember: Instructions that are not followed completely may result in serious medical risk, up to and including death, or upon the discretion of your surgeon and anesthesiologist your surgery may need to be rescheduled.    x____ 1. Do not eat food or drink liquids after midnight. No gum chewing or hard candies.     __x__ 2. No Alcohol for 24 hours before or after surgery.   ____ 3. Bring all medications with you on the day of surgery if instructed.    __x__ 4. Notify your doctor if there is any change in your medical condition     (cold, fever, infections).     Do not wear jewelry, make-up, hairpins, clips or nail polish.  Do not wear lotions, powders, or perfumes. You may wear deodorant.  Do not shave 48 hours prior to surgery. Men may shave face and neck.  Do not bring valuables to the hospital.    [facility] is not responsible for any belongings or valuables.               Contacts, dentures or bridgework may not be worn into surgery.  Leave your suitcase in the car. After surgery it may be brought to your room.  For patients admitted to the hospital, discharge time is determined by your                treatment team.   Patients discharged the day of surgery will not be allowed to drive home.   Please read over the following fact sheets that you were given:   Surgical Site Infection Prevention   ____ Take these medicines the morning of surgery with A SIP OF WATER:    1.   2.   3.   4.  5.  6.  ____ Fleet Enema (as directed)   ____ Use CHG Soap as directed  ____ Use inhalers on the day of surgery  ____ Stop metformin 2 days prior to surgery    ____ Take 1/2 of usual insulin dose the night before surgery and none on the morning of surgery.   ____ Stop Coumadin/Plavix/aspirin on   ____ Stop Anti-inflammatories on     ____ Stop supplements until after surgery.    ____ Bring C-Pap to the hospital.

## 2015-06-21 ENCOUNTER — Ambulatory Visit: Payer: Medicaid Other | Admitting: Anesthesiology

## 2015-06-21 ENCOUNTER — Encounter: Payer: Self-pay | Admitting: *Deleted

## 2015-06-21 ENCOUNTER — Ambulatory Visit
Admission: RE | Admit: 2015-06-21 | Discharge: 2015-06-21 | Disposition: A | Discharge status: Home / Self Care | Payer: Medicaid Other | Source: Ambulatory Visit | Attending: Obstetrics and Gynecology | Admitting: Obstetrics and Gynecology

## 2015-06-21 ENCOUNTER — Encounter
Admission: RE | Disposition: A | Discharge status: Home / Self Care | Payer: Self-pay | Source: Ambulatory Visit | Attending: Obstetrics and Gynecology

## 2015-06-21 DIAGNOSIS — K429 Umbilical hernia without obstruction or gangrene: Secondary | ICD-10-CM | POA: Insufficient documentation

## 2015-06-21 DIAGNOSIS — Z9851 Tubal ligation status: Secondary | ICD-10-CM

## 2015-06-21 DIAGNOSIS — Z302 Encounter for sterilization: Secondary | ICD-10-CM | POA: Diagnosis not present

## 2015-06-21 HISTORY — PX: LAPAROSCOPIC TUBAL LIGATION: SHX1937

## 2015-06-21 LAB — POCT PREGNANCY, URINE: Preg Test, Ur: NEGATIVE

## 2015-06-21 SURGERY — LIGATION, FALLOPIAN TUBE, LAPAROSCOPIC
Anesthesia: General | Laterality: Bilateral | Wound class: Clean Contaminated

## 2015-06-21 MED ORDER — HYDROMORPHONE HCL 1 MG/ML IJ SOLN
INTRAMUSCULAR | Status: AC
  Administered 2015-06-21: 0.25 mg via INTRAVENOUS
  Filled 2015-06-21: qty 1

## 2015-06-21 MED ORDER — ACETAMINOPHEN 10 MG/ML IV SOLN
INTRAVENOUS | Status: DC | PRN
  Administered 2015-06-21: 1000 mg via INTRAVENOUS

## 2015-06-21 MED ORDER — ROCURONIUM BROMIDE 100 MG/10ML IV SOLN
INTRAVENOUS | Status: DC | PRN
  Administered 2015-06-21: 40 mg via INTRAVENOUS

## 2015-06-21 MED ORDER — ONDANSETRON HCL 4 MG/2ML IJ SOLN: 4.0000 mg | Freq: Once | INTRAMUSCULAR | Status: DC | PRN

## 2015-06-21 MED ORDER — FENTANYL CITRATE (PF) 100 MCG/2ML IJ SOLN
INTRAMUSCULAR | Status: AC
  Administered 2015-06-21: 25 ug via INTRAVENOUS
  Filled 2015-06-21: qty 2

## 2015-06-21 MED ORDER — ACETAMINOPHEN 10 MG/ML IV SOLN
INTRAVENOUS | Status: AC
  Filled 2015-06-21: qty 100

## 2015-06-21 MED ORDER — FENTANYL CITRATE (PF) 250 MCG/5ML IJ SOLN
INTRAMUSCULAR | Status: DC | PRN
  Administered 2015-06-21 (×3): 50 ug via INTRAVENOUS

## 2015-06-21 MED ORDER — MIDAZOLAM HCL 5 MG/5ML IJ SOLN
INTRAMUSCULAR | Status: DC | PRN
  Administered 2015-06-21: 2 mg via INTRAVENOUS

## 2015-06-21 MED ORDER — METOCLOPRAMIDE HCL 5 MG/ML IJ SOLN
INTRAMUSCULAR | Status: DC | PRN
  Administered 2015-06-21: 10 mg via INTRAVENOUS

## 2015-06-21 MED ORDER — HYDROMORPHONE HCL 1 MG/ML IJ SOLN
0.2500 mg | INTRAMUSCULAR | Status: DC | PRN
  Administered 2015-06-21 (×8): 0.25 mg via INTRAVENOUS

## 2015-06-21 MED ORDER — PROPOFOL 10 MG/ML IV BOLUS
INTRAVENOUS | Status: DC | PRN
  Administered 2015-06-21: 150 mg via INTRAVENOUS

## 2015-06-21 MED ORDER — FENTANYL CITRATE (PF) 100 MCG/2ML IJ SOLN
25.0000 ug | INTRAMUSCULAR | Status: DC | PRN
  Administered 2015-06-21 (×4): 25 ug via INTRAVENOUS

## 2015-06-21 MED ORDER — BUPIVACAINE HCL 0.5 % IJ SOLN
INTRAMUSCULAR | Status: DC | PRN
  Administered 2015-06-21: 17 mL

## 2015-06-21 MED ORDER — SUGAMMADEX SODIUM 500 MG/5ML IV SOLN
INTRAVENOUS | Status: DC | PRN
  Administered 2015-06-21: 208.6 mg via INTRAVENOUS

## 2015-06-21 MED ORDER — ONDANSETRON HCL 4 MG/2ML IJ SOLN
INTRAMUSCULAR | Status: DC | PRN
  Administered 2015-06-21: 4 mg via INTRAVENOUS

## 2015-06-21 MED ORDER — BUPIVACAINE HCL (PF) 0.5 % IJ SOLN
INTRAMUSCULAR | Status: AC
  Filled 2015-06-21: qty 30

## 2015-06-21 MED ORDER — LIDOCAINE HCL (CARDIAC) 20 MG/ML IV SOLN
INTRAVENOUS | Status: DC | PRN
  Administered 2015-06-21: 80 mg via INTRAVENOUS

## 2015-06-21 MED ORDER — LACTATED RINGERS IV SOLN
INTRAVENOUS | Status: DC
Start: 2015-06-21 — End: 2015-06-21
  Administered 2015-06-21: 07:00:00 via INTRAVENOUS

## 2015-06-21 MED ORDER — OXYCODONE-ACETAMINOPHEN 5-325 MG PO TABS: 1.0000 | ORAL_TABLET | Freq: Four times a day (QID) | ORAL | Status: DC | PRN

## 2015-06-21 MED ORDER — OXYCODONE-ACETAMINOPHEN 5-325 MG PO TABS
ORAL_TABLET | ORAL | Status: AC
  Administered 2015-06-21: 2 via ORAL
  Filled 2015-06-21: qty 2

## 2015-06-21 MED ORDER — DEXAMETHASONE SODIUM PHOSPHATE 10 MG/ML IJ SOLN
INTRAMUSCULAR | Status: DC | PRN
  Administered 2015-06-21: 8 mg via INTRAVENOUS

## 2015-06-21 SURGICAL SUPPLY — 20 items
APR BAND 32X8 2 INCS BAND (Ring) ×1 IMPLANT
BLADE SURG SZ11 CARB STEEL (BLADE) ×3 IMPLANT
CATH ROBINSON RED A/P 16FR (CATHETERS) ×3 IMPLANT
CHLORAPREP W/TINT 26ML (MISCELLANEOUS) ×3 IMPLANT
DRSG TEGADERM 2-3/8X2-3/4 SM (GAUZE/BANDAGES/DRESSINGS) ×3 IMPLANT
GLOVE BIO SURGEON STRL SZ7 (GLOVE) ×3 IMPLANT
GLOVE INDICATOR 7.5 STRL GRN (GLOVE) ×3 IMPLANT
GOWN STRL REUS W/ TWL LRG LVL3 (GOWN DISPOSABLE) ×2 IMPLANT
GOWN STRL REUS W/TWL LRG LVL3 (GOWN DISPOSABLE) ×6
KIT DISPOSABLE FALLOPE RING (Ring) ×3 IMPLANT
KIT RM TURNOVER CYSTO AR (KITS) ×3 IMPLANT
LABEL OR SOLS (LABEL) ×3 IMPLANT
LIQUID BAND (GAUZE/BANDAGES/DRESSINGS) ×3 IMPLANT
NS IRRIG 500ML POUR BTL (IV SOLUTION) ×3 IMPLANT
PACK GYN LAPAROSCOPIC (MISCELLANEOUS) ×3 IMPLANT
PAD OB MATERNITY 4.3X12.25 (PERSONAL CARE ITEMS) ×3 IMPLANT
PAD PREP 24X41 OB/GYN DISP (PERSONAL CARE ITEMS) ×3 IMPLANT
SUT MNCRL AB 4-0 PS2 18 (SUTURE) ×3 IMPLANT
TROCAR XCEL NON-BLD 5MMX100MML (ENDOMECHANICALS) ×3 IMPLANT
TUBING INSUFFLATOR HI FLOW (MISCELLANEOUS) ×3 IMPLANT

## 2015-06-21 NOTE — Op Note (Signed)
Preoperative Diagnosis: 1) 34 y.o.  [id number] desiring permanent surgical sterilization  Postoperative Diagnosis: 1) 34 y.o. [id number] desiring permanent surgical sterilization  Operation Performed: Laparoscopic bilateral tubal ligation via falope ring application  Indication: 34 y.o. [id number] with undesired fertility, desires permanent sterilization.  Other reversible forms of contraception were discussed with patient; she declines all other modalities. Permanent nature of as well as associated risks of the procedure discussed with patient including but not limited to: risk of regret, permanence of method, bleeding, infection, injury to surrounding organs and need for additional procedures.  Failure risk of 0.5-1% with increased risk of ectopic gestation if pregnancy occurs was also discussed with patient.    Anesthesia: General  Preoperative Antibiotics: none  Estimated Blood Loss: minimal  IV Fluids: 624mL  Urine Output:: ~55mL  Drains or Tubes: none  Implants: none  Specimens Removed: none  Complications: none  Intraoperative Findings: Normal right tube, left tube surgically absent, normal ovaries, and uterus.  Good 1cm knuckle of tube after falope ring application right tube.  Normal liver, gallbladder, and appendix.  Small umbilical hernia  Patient Condition: stable  Procedure in Detail:  Patient was taken to the operating room where she was administered general anesthesia.  She was positioned in the dorsal lithotomy position utilizing Allen stirups, prepped and draped in the usual sterile fashion.  Prior to proceeding with procedure a time out was performed.  Attention was turned to the patient's pelvis.  A red rubber catheter was used to empty the patient's bladder.  An operative speculum was placed to allow visualization of the cervix.  The anterior lip of the cervix was grasped with a single tooth tenaculum, and a Hulka tenaculum was placed to allow manipulation of the uterus.   The operative speculum and single tooth tenaculum were then removed.  Attention was turned to the patient's abdomen.  The umbilicus was infiltrated with 1% Sensorcaine, before making a stab incision using an 11 blade scalpel.  A 17mm Excel trocar was then used to gain direct entry into the peritoneal cavity utilizing the camera to visualize progress of the trocar during placement.  Once peritoneal entry had been achieved, insufflation was started and pneumoperitoneum established at a pressure of 101mmHg.   General inspection of the abdomen revealed the above noted findings.  A spot 2cm midline above the pubic symphysis was injected with 1% Sensorcaine and a stab incision was made using an 11 blade scalpel.  The 33mm falope ring trocar was place suprapubic through this incision under direct visualization.  The right tube was identified and grasped in a mid-isthmic portion using the falope ring application.  The falope ring was applied with a good 1cm knuckle of blanching tube noted within the falope ring after application.  The left tube was noted to be surgically absent consistent with patient history of salpingectomy for prior ectopic.  Pneumoperitoneum was evacuated.  The trocars were removed.  The 53mm trocar site was closed with 4-0 Monocryl in a subcuticular fashion.  All trocar sites were then dressed with surgical skin glue.  The Hulka tenaculum was removed.  Silver nitrate was applied to the tenaculum site.  Sponge needle and instrument counts were correct time two.  The patient tolerated the procedure well and was taken to the recovery room in stable condition.

## 2015-06-21 NOTE — H&P (Signed)
Date of Initial paper H&P: 06/13/2015  History reviewed, patient examined, no change in status, stable for surgery.

## 2015-06-21 NOTE — Anesthesia Procedure Notes (Signed)
Procedure Name: Intubation Date/Time: 06/21/2015 7:37 AM Performed by: Delaney Meigs Pre-anesthesia Checklist: Patient identified, Emergency Drugs available, Suction available, Patient being monitored and Timeout performed Patient Re-evaluated:Patient Re-evaluated prior to inductionOxygen Delivery Method: Circle system utilized Preoxygenation: Pre-oxygenation with 100% oxygen Intubation Type: IV induction Ventilation: Mask ventilation without difficulty Laryngoscope Size: Mac and 3 Grade View: Grade I Tube type: Oral Tube size: 7.0 mm Airway Equipment and Method: Stylet Placement Confirmation: ETT inserted through vocal cords under direct vision,  positive ETCO2 and breath sounds checked- equal and bilateral Tube secured with: Tape Dental Injury: Teeth and Oropharynx as per pre-operative assessment

## 2015-06-21 NOTE — Discharge Instructions (Addendum)
AMBULATORY SURGERY  DISCHARGE INSTRUCTIONS   1) The drugs that you were given will stay in your system until tomorrow so for the next 24 hours you should not:  A) Drive an automobile B) Make any legal decisions C) Drink any alcoholic beverage   2) You may resume regular meals tomorrow.  Today it is better to start with liquids and gradually work up to solid foods.  You may eat anything you prefer, but it is better to start with liquids, then soup and crackers, and gradually work up to solid foods.   3) Please notify your doctor immediately if you have any unusual bleeding, trouble breathing, redness and pain at the surgery site, drainage, fever, or pain not relieved by medication. 4)   5) Your post-operative visit with Dr.                                     is: Date:                        Time:    Please call to schedule your post-operative visit.  6) Additional Instructions:     Laparoscopic Tubal Ligation Care After Refer to this sheet in the next few weeks. These instructions provide you with information on caring for yourself after your procedure. Your caregiver may also give you more specific instructions. Your treatment has been planned according to current medical practices, but problems sometimes occur. Call your caregiver if you have any problems or questions after your procedure. HOME CARE INSTRUCTIONS  Rest the remainder of the day. Only take over-the-counter or prescription medicines for pain, discomfort, or fever as directed by your caregiver. Do not take aspirin. It can cause bleeding. Gradually resume daily activities, diet, rest, driving, and work. Avoid sexual intercourse for 2 weeks or as directed. Do not use tampons or douche. Do not drive while taking pain medicine. Do not lift anything over 5 pounds for 2 weeks or as directed. Only take showers, not baths, until you are seen by your caregiver. Change bandages (dressings) as directed. Take your  temperature twice a day and record it. Try to have help for the first 7 to 10 days for your household needs. Return to your caregiver to get your stitches (sutures) removed and for follow-up visits as directed. SEEK MEDICAL CARE IF:  You have redness, swelling, or increasing pain in a wound. You have drainage from a wound lasting longer than 1 day. Your pain is getting worse. You have a rash. You become dizzy or lightheaded. You have a reaction to your medicine. You need stronger medicine or a change in your pain medicine. You notice a bad smell coming from a wound or dressing. Your wound breaks open after the sutures have been removed. You are constipated. SEEK IMMEDIATE MEDICAL CARE IF:  You faint. You have a fever. You have increasing abdominal pain. You have severe pain in your shoulders. You have bleeding or drainage from the suture sites or vagina following surgery. You have shortness of breath or difficulty breathing. You have chest or leg pain. You have persistent nausea, vomiting, or diarrhea. MAKE SURE YOU:  Understand these instructions. Watch your condition. Get help right away if you are not doing well or get worse. Document Released: 06/29/2005 Document Revised: 06/10/2012 Document Reviewed: 03/22/2012 [facility] Patient Information 2015 [place], [place]. This information is not intended to replace  advice given to you by your health care provider. Make sure you discuss any questions you have with your health care provider.

## 2015-06-21 NOTE — Transfer of Care (Signed)
Immediate Anesthesia Transfer of Care Note  Patient: Katie Villa  Procedure(s) Performed: Procedure(s): LAPAROSCOPIC TUBAL LIGATION (Bilateral)  Patient Location: PACU  Anesthesia Type:General  Level of Consciousness: awake, alert  and oriented  Airway & Oxygen Therapy: Patient Spontanous Breathing and Patient connected to face mask oxygen  Post-op Assessment: Report given to RN and Post -op Vital signs reviewed and stable  Post vital signs: Reviewed and stable  Last Vitals:  Filed Vitals:   06/21/15 0829  BP: 133/73  Pulse: 72  Temp: 36.2 C  Resp: 15    Complications: No apparent anesthesia complications

## 2015-06-21 NOTE — Anesthesia Postprocedure Evaluation (Signed)
  Anesthesia Post-op Note  Patient: Katie Villa  Procedure(s) Performed: Procedure(s): LAPAROSCOPIC TUBAL LIGATION (Bilateral)  Anesthesia type:General ETT  Patient location: PACU  Post pain: Pain level controlled  Post assessment: Post-op Vital signs reviewed, Patient's Cardiovascular Status Stable, Respiratory Function Stable, Patent Airway and No signs of Nausea or vomiting  Post vital signs: Reviewed and stable  Last Vitals:  Filed Vitals:   06/21/15 1043  BP: 136/73  Pulse: 63  Temp:   Resp: 16    Level of consciousness: awake, alert  and patient cooperative  Complications: No apparent anesthesia complications

## 2015-06-21 NOTE — Anesthesia Preprocedure Evaluation (Addendum)
Anesthesia Evaluation  Patient identified by MRN, date of birth, ID band Patient awake    Reviewed: Allergy & Precautions, H&P , NPO status , Patient's Chart, lab work & pertinent test results, reviewed documented beta blocker date and time   Airway Mallampati: II  TM Distance: >3 FB Neck ROM: full    Dental   Pulmonary Current Smoker,          Cardiovascular Rate:Normal     Neuro/Psych    GI/Hepatic   Endo/Other    Renal/GU      Musculoskeletal   Abdominal   Peds  Hematology  (+) anemia ,   Anesthesia Other Findings   Reproductive/Obstetrics                            Anesthesia Physical Anesthesia Plan  ASA: II  Anesthesia Plan: General ETT   Post-op Pain Management:    Induction:   Airway Management Planned:   Additional Equipment:   Intra-op Plan:   Post-operative Plan:   Informed Consent: I have reviewed the patients History and Physical, chart, labs and discussed the procedure including the risks, benefits and alternatives for the proposed anesthesia with the patient or authorized representative who has indicated his/her understanding and acceptance.     Plan Discussed with: CRNA  Anesthesia Plan Comments:         Anesthesia Quick Evaluation

## 2015-06-23 ENCOUNTER — Ambulatory Visit (INDEPENDENT_AMBULATORY_CARE_PROVIDER_SITE_OTHER): Payer: Medicaid Other | Admitting: Urology

## 2015-06-23 ENCOUNTER — Encounter: Payer: Self-pay | Admitting: Urology

## 2015-06-23 VITALS — BP 118/82 | HR 78 | Ht 70.0 in | Wt 230.6 lb

## 2015-06-23 DIAGNOSIS — Z87448 Personal history of other diseases of urinary system: Secondary | ICD-10-CM

## 2015-06-23 DIAGNOSIS — Z87898 Personal history of other specified conditions: Secondary | ICD-10-CM

## 2015-06-23 DIAGNOSIS — N1339 Other hydronephrosis: Secondary | ICD-10-CM

## 2015-06-23 DIAGNOSIS — R109 Unspecified abdominal pain: Secondary | ICD-10-CM

## 2015-06-23 LAB — URINALYSIS, COMPLETE
BILIRUBIN UA: NEGATIVE
GLUCOSE, UA: NEGATIVE
Ketones, UA: NEGATIVE
Nitrite, UA: NEGATIVE
PH UA: 6 (ref 5.0–7.5)
Protein, UA: NEGATIVE
SPEC GRAV UA: 1.01 (ref 1.005–1.030)
UUROB: 0.2 mg/dL (ref 0.2–1.0)

## 2015-06-23 LAB — BLADDER SCAN AMB NON-IMAGING

## 2015-06-23 LAB — MICROSCOPIC EXAMINATION: BACTERIA UA: NONE SEEN

## 2015-06-23 NOTE — Progress Notes (Signed)
06/23/2015 11:42 AM   Seleta Rhymes Nicole Kindred [birth year] [id number]  Referring provider: Lorelee Market, MD [place], [place] [address]  Chief Complaint  Patient presents with  . Flank Pain    HPI: Mrs. Prudencio Burly is a 34 year old white female who presents today complaining of right flank pain.  At [redacted] weeks gestation, she underwent right URS with Dr. Rick Duff on 03/07/2015.   No etiology was discovered for the hydronephrosis and patient was to be managed conservatively until she gave birth. She was also seen in the emergency room for right-sided pain on 03/08/2015.  A renal ultrasound was performed which noted right hydronephrosis and urinary retention. She was instructed on CIC and did self catheterized for a brief time and  the flank pain abated and she was able to void on her own.  She states that over the last few weeks the right-sided flank pain has returned and has become more intense. It lasts for several hours at a time. It is colicky in nature. It is independent of movement. She denied any gross hematuria, fevers, chills, nausea or vomiting.  She is status post tubal ligation 3 days ago.   PMH: Past Medical History  Diagnosis Date  . Anemia   . Hydronephrosis   . Acute cystitis with hematuria   . Pelvicaliectasis   . Cleft palate with cleft lip   . IUFD (intrauterine fetal death)   . Gross hematuria     Surgical History: Past Surgical History  Procedure Laterality Date  . Kidney surgery    . Cleft lip repair    . Ectopic pregnancy surgery    . Laparoscopic tubal ligation Bilateral 06/21/2015    Procedure: LAPAROSCOPIC TUBAL LIGATION;  Surgeon: Malachy Mood, MD;  Location: [facility] ORS;  Service: Gynecology;  Laterality: Bilateral;    Home Medications:    Medication List       This list is accurate as of: 06/23/15 11:42 AM.  Always use your most recent med list.               ibuprofen 600 MG tablet  Commonly known as:  ADVIL,MOTRIN  Take 1 tablet (600  mg total) by mouth every 6 (six) hours.     oxyCODONE-acetaminophen 5-325 MG per tablet  Commonly known as:  PERCOCET/ROXICET  Take 1-2 tablets by mouth every 6 (six) hours as needed.        Allergies: No Known Allergies  Family History: Family History  Problem Relation Age of Onset  . Cancer Mother   . Cancer Maternal Aunt     Social History:  reports that she has never smoked. She does not have any smokeless tobacco history on file. She reports that she does not drink alcohol or use illicit drugs.  ROS: Urological Symptom Review  Patient is experiencing the following symptoms: Frequent urination Hard to postpone urination Get up at night to urinate Leakage of urine   Review of Systems  Gastrointestinal (upper)  : Indigestion/heartburn  Gastrointestinal (lower) : Constipation  Constitutional : Night Sweats Fatigue  Skin: Negative for skin symptoms  Eyes: Negative for eye symptoms  Ear/Nose/Throat : Negative for Ear/Nose/Throat symptoms  Hematologic/Lymphatic: Easy bruising  Cardiovascular : Leg swelling Chest pain  Respiratory : Cough Shortness of breath  Endocrine: Excessive thirst  Musculoskeletal: Back pain Joint pain  Neurological: Headaches Dizziness  Psychologic: Negative for psychiatric symptoms   Physical Exam: BP 118/82 mmHg  Pulse 78  Ht 5\' 10"  (1.778 m)  Wt 230 lb  9.6 oz (104.599 kg)  BMI 33.09 kg/m2  Constitutional:  Alert and oriented, No acute distress. HEENT: [place] AT, moist mucus membranes.  Trachea midline, no masses. Cardiovascular: No clubbing, cyanosis, or edema. Respiratory: Normal respiratory effort, no increased work of breathing. GI: Abdomen is soft, nontender, nondistended, no abdominal masses GU: Mild right  CVA tenderness.  Skin: No rashes, bruises or suspicious lesions. Lymph: No cervical or inguinal adenopathy. Neurologic: Grossly intact, no focal deficits, moving all 4 extremities. Psychiatric:  Normal mood and affect.  Laboratory Data: Results for orders placed or performed in visit on 06/23/15  CULTURE, URINE COMPREHENSIVE  Result Value Ref Range   Urine Culture, Comprehensive Final report (A)    Result 1 Enterococcus species (A)    Result 2 Comment    ANTIMICROBIAL SUSCEPTIBILITY Comment   Microscopic Examination  Result Value Ref Range   WBC, UA 0-5 0 -  5 /hpf   RBC, UA 3-10 (A) 0 -  2 /hpf   Epithelial Cells (non renal) >10 (A) 0 - 10 /hpf   Bacteria, UA None seen None seen/Few  Urinalysis, Complete  Result Value Ref Range   Specific Gravity, UA 1.010 1.005 - 1.030   pH, UA 6.0 5.0 - 7.5   Color, UA Yellow Yellow   Appearance Ur Hazy (A) Clear   Leukocytes, UA 2+ (A) Negative   Protein, UA Negative Negative/Trace   Glucose, UA Negative Negative   Ketones, UA Negative Negative   RBC, UA 3+ (A) Negative   Bilirubin, UA Negative Negative   Urobilinogen, Ur 0.2 0.2 - 1.0 mg/dL   Nitrite, UA Negative Negative   Microscopic Examination See below:   BLADDER SCAN AMB NON-IMAGING  Result Value Ref Range   Scan Result 55ml    Lab Results  Component Value Date   WBC 8.3 05/13/2015   HGB 9.6* 05/13/2015   HCT 28.7* 05/13/2015   MCV 84.3 05/13/2015   PLT 157 05/13/2015    Lab Results  Component Value Date   CREATININE 0.77 09/21/2014    No results found for: PSA  No results found for: TESTOSTERONE  No results found for: HGBA1C  Urinalysis No results found for: COLORURINE, APPEARANCEUR, LABSPEC, PHURINE, GLUCOSEU, HGBUR, BILIRUBINUR, KETONESUR, PROTEINUR, UROBILINOGEN, NITRITE, LEUKOCYTESUR  Pertinent Imaging:   Assessment & Plan:    1. Flank pain:   Patient has a prior history of hydronephrosis. She has underwent renal ultrasounds and a right URS for evaluation of the hydronephrosis, no obstructive phenomenon was identified and no studies. We will pursue a noncontrast CT for further evaluation.  - Urinalysis, Complete - CULTURE, URINE  COMPREHENSIVE  2. History of urinary retention:   Patient is voiding well on her own. PVR is 21 mL today. - BLADDER SCAN AMB NON-IMAGING  3. Hydronephrosis:   Patient will undergo noncontrast CT to try to identify the cause of her hydronephrosis found on renal ultrasound earlier this spring.  I will not pursue a contrast study at this time because patient is currently breast-feeding her infant child.    No Follow-up on file.  Zara Council, [facility] [address], [place], [place] [address] [phone]

## 2015-06-25 LAB — CULTURE, URINE COMPREHENSIVE

## 2015-06-26 ENCOUNTER — Other Ambulatory Visit: Payer: Self-pay | Admitting: Urology

## 2015-06-26 DIAGNOSIS — Z87898 Personal history of other specified conditions: Secondary | ICD-10-CM | POA: Insufficient documentation

## 2015-06-26 DIAGNOSIS — N1339 Other hydronephrosis: Secondary | ICD-10-CM | POA: Insufficient documentation

## 2015-06-26 DIAGNOSIS — R109 Unspecified abdominal pain: Secondary | ICD-10-CM | POA: Insufficient documentation

## 2015-06-28 ENCOUNTER — Telehealth: Payer: Self-pay

## 2015-06-28 DIAGNOSIS — N390 Urinary tract infection, site not specified: Secondary | ICD-10-CM

## 2015-06-28 MED ORDER — AMOXICILLIN-POT CLAVULANATE 875-125 MG PO TABS
1.0000 | ORAL_TABLET | Freq: Two times a day (BID) | ORAL | Status: AC
Start: 2015-06-28 — End: 2015-07-05

## 2015-06-28 NOTE — Telephone Encounter (Signed)
Spoke with pt in reference to infection. Pt stated she has stopped breast feeding. Please advise. Cw,lpn

## 2015-06-28 NOTE — Telephone Encounter (Signed)
Spoke with pt in reference to infection. Pt voiced understanding. Cw,lpn

## 2015-06-28 NOTE — Telephone Encounter (Signed)
We can prescribe Augmentin 875/125 one twice daily for seven days.

## 2015-06-28 NOTE — Telephone Encounter (Signed)
-----   Message from Nori Riis, PA-C sent at 06/26/2015 12:50 PM EDT ----- Patient has a +UCx, but she has a newborn and is breast feeding.  She will need to contact her pediatrician to see if it is safe to take a PCN.

## 2015-07-07 ENCOUNTER — Ambulatory Visit
Admission: RE | Admit: 2015-07-07 | Discharge: 2015-07-07 | Disposition: A | Discharge status: Home / Self Care | Payer: Medicaid Other | Source: Ambulatory Visit | Attending: Urology | Admitting: Urology

## 2015-07-07 DIAGNOSIS — R109 Unspecified abdominal pain: Secondary | ICD-10-CM | POA: Insufficient documentation

## 2015-07-21 ENCOUNTER — Encounter: Payer: Self-pay | Admitting: Urology

## 2015-07-21 ENCOUNTER — Ambulatory Visit (INDEPENDENT_AMBULATORY_CARE_PROVIDER_SITE_OTHER): Payer: Medicaid Other | Admitting: Urology

## 2015-07-21 VITALS — BP 121/86 | HR 101 | Ht 70.0 in | Wt 230.8 lb

## 2015-07-21 DIAGNOSIS — Z87898 Personal history of other specified conditions: Secondary | ICD-10-CM

## 2015-07-21 DIAGNOSIS — R109 Unspecified abdominal pain: Secondary | ICD-10-CM

## 2015-07-21 DIAGNOSIS — Z87448 Personal history of other diseases of urinary system: Secondary | ICD-10-CM | POA: Diagnosis not present

## 2015-07-21 LAB — MICROSCOPIC EXAMINATION: RBC, UA: NONE SEEN /hpf (ref 0–?)

## 2015-07-21 LAB — URINALYSIS, COMPLETE
BILIRUBIN UA: NEGATIVE
Glucose, UA: NEGATIVE
KETONES UA: NEGATIVE
Leukocytes, UA: NEGATIVE
Nitrite, UA: NEGATIVE
PH UA: 7.5 (ref 5.0–7.5)
Protein, UA: NEGATIVE
RBC UA: NEGATIVE
Specific Gravity, UA: 1.015 (ref 1.005–1.030)
UUROB: 0.2 mg/dL (ref 0.2–1.0)

## 2015-07-21 NOTE — Progress Notes (Signed)
9:30 AM   Katie Villa 1981-05-17 [id number]  Referring provider: Lorelee Market, MD [place], [place] [address]  Chief Complaint  Patient presents with  . Follow-up    CT results    HPI: Katie Villa is a 34 year old white female who presets today to discuss her CT scan results.    Previous history:  At [redacted] weeks gestation, she underwent right URS with Dr. Rick Duff on 03/07/2015.   No etiology was discovered for the hydronephrosis and patient was to be managed conservatively until she gave birth. She was also seen in the emergency room for right-sided pain on 03/08/2015.  A renal ultrasound was performed which noted right hydronephrosis and urinary retention. She was instructed on CIC and did self catheterized for a brief time and  the flank pain abated and she was able to void on her own.  When she presented to the office on 06/23/2015, her PVR was 21 mL.  Today, she states that over the last few weeks since her CT scan, the right-sided flank pain has been intermittent.  It is not present in the morning, but it starts to bother her in the afternoons.  The pain then continues and worsens as the day progresses.  She then sleeps through the night and when she wakes, the pain is not present.  It is independent of movement. She denied any gross hematuria, fevers, chills, nausea or vomiting.  She is status post tubal ligation one month ago.  I have reviewed the films with Dr. Erlene Quan and there is no renal or ureteral stones noted.    She did have a +UCx for enterococcus and was treated with the appropriate antibiotic.  Her UA is unremarkable today.    PMH: Past Medical History  Diagnosis Date  . Anemia   . Hydronephrosis   . Acute cystitis with hematuria   . Pelvicaliectasis   . Cleft palate with cleft lip   . IUFD (intrauterine fetal death)   . Gross hematuria     Surgical History: Past Surgical History  Procedure Laterality Date  . Kidney surgery    . Cleft  lip repair    . Ectopic pregnancy surgery    . Laparoscopic tubal ligation Bilateral 06/21/2015    Procedure: LAPAROSCOPIC TUBAL LIGATION;  Surgeon: Malachy Mood, MD;  Location: [facility] ORS;  Service: Gynecology;  Laterality: Bilateral;    Home Medications:    Medication List       This list is accurate as of: 07/21/15  9:30 AM.  Always use your most recent med list.               ibuprofen 600 MG tablet  Commonly known as:  ADVIL,MOTRIN  Take 1 tablet (600 mg total) by mouth every 6 (six) hours.        Allergies: No Known Allergies  Family History: Family History  Problem Relation Age of Onset  . Cancer Mother   . Cancer Maternal Aunt     Social History:  reports that she has never smoked. She does not have any smokeless tobacco history on file. She reports that she does not drink alcohol or use illicit drugs.  ROS: Urological Symptom Review  Patient is experiencing the following symptoms: Frequent urination Hard to postpone urination Get up at night to urinate Leakage of urine   Review of Systems  Gastrointestinal (upper)  : Indigestion/heartburn  Gastrointestinal (lower) : Constipation  Constitutional : Night Sweats Fatigue  Skin:  Negative for skin symptoms  Eyes: Negative for eye symptoms  Ear/Nose/Throat : Negative for Ear/Nose/Throat symptoms  Hematologic/Lymphatic: Easy bruising  Cardiovascular : Leg swelling Chest pain  Respiratory : Cough Shortness of breath  Endocrine: Excessive thirst  Musculoskeletal: Back pain Joint pain  Neurological: Headaches Dizziness  Psychologic: Negative for psychiatric symptoms   Physical Exam: BP 121/86 mmHg  Pulse 101  Ht 5\' 10"  (1.778 m)  Wt 230 lb 12.8 oz (104.69 kg)  BMI 33.12 kg/m2   Laboratory Data: Results for orders placed or performed in visit on 07/21/15  Microscopic Examination  Result Value Ref Range   WBC, UA 0-5 0 -  5 /hpf   RBC, UA None seen 0 -  2 /hpf    Epithelial Cells (non renal) 0-10 0 - 10 /hpf   Bacteria, UA Few (A) None seen/Few  Urinalysis, Complete  Result Value Ref Range   Specific Gravity, UA 1.015 1.005 - 1.030   pH, UA 7.5 5.0 - 7.5   Color, UA Yellow Yellow   Appearance Ur Clear Clear   Leukocytes, UA Negative Negative   Protein, UA Negative Negative/Trace   Glucose, UA Negative Negative   Ketones, UA Negative Negative   RBC, UA Negative Negative   Bilirubin, UA Negative Negative   Urobilinogen, Ur 0.2 0.2 - 1.0 mg/dL   Nitrite, UA Negative Negative   Microscopic Examination See below:    Results for orders placed or performed in visit on 06/23/15  CULTURE, URINE COMPREHENSIVE  Result Value Ref Range   Urine Culture, Comprehensive Final report (A)    Result 1 Enterococcus species (A)    Result 2 Comment    ANTIMICROBIAL SUSCEPTIBILITY Comment   Microscopic Examination  Result Value Ref Range   WBC, UA 0-5 0 -  5 /hpf   RBC, UA 3-10 (A) 0 -  2 /hpf   Epithelial Cells (non renal) >10 (A) 0 - 10 /hpf   Bacteria, UA None seen None seen/Few  Urinalysis, Complete  Result Value Ref Range   Specific Gravity, UA 1.010 1.005 - 1.030   pH, UA 6.0 5.0 - 7.5   Color, UA Yellow Yellow   Appearance Ur Hazy (A) Clear   Leukocytes, UA 2+ (A) Negative   Protein, UA Negative Negative/Trace   Glucose, UA Negative Negative   Ketones, UA Negative Negative   RBC, UA 3+ (A) Negative   Bilirubin, UA Negative Negative   Urobilinogen, Ur 0.2 0.2 - 1.0 mg/dL   Nitrite, UA Negative Negative   Microscopic Examination See below:   BLADDER SCAN AMB NON-IMAGING  Result Value Ref Range   Scan Result 80ml    Lab Results  Component Value Date   WBC 8.3 05/13/2015   HGB 9.6* 05/13/2015   HCT 28.7* 05/13/2015   MCV 84.3 05/13/2015   PLT 157 05/13/2015    Lab Results  Component Value Date   CREATININE 0.77 09/21/2014    No results found for: PSA  No results found for: TESTOSTERONE  No results found for:  HGBA1C  Urinalysis    Component Value Date/Time   COLORURINE Yellow 09/21/2014 1234   APPEARANCEUR Cloudy 09/21/2014 1234   LABSPEC 1.015 09/21/2014 1234   PHURINE 8.0 09/21/2014 1234   GLUCOSEU Negative 06/23/2015 1135   GLUCOSEU Negative 09/21/2014 1234   HGBUR Negative 09/21/2014 1234   BILIRUBINUR Negative 06/23/2015 1135   BILIRUBINUR Negative 09/21/2014 1234   KETONESUR Negative 09/21/2014 1234   PROTEINUR Negative 09/21/2014 1234   NITRITE Negative  06/23/2015 1135   NITRITE Negative 09/21/2014 1234   LEUKOCYTESUR 2+* 06/23/2015 1135   LEUKOCYTESUR Negative 09/21/2014 1234    Pertinent Imaging: CLINICAL DATA: Right flank pain  EXAM: CT ABDOMEN AND PELVIS WITHOUT CONTRAST  TECHNIQUE: Multidetector CT imaging of the abdomen and pelvis was performed following the standard protocol without IV contrast.  COMPARISON: None  FINDINGS: Lower chest: There is no pleural effusion identified. The lung bases appear clear.  Hepatobiliary: There is no focal liver abnormality. The gallbladder is unremarkable. No biliary dilatation.  Pancreas: Normal appearance of the pancreas.  Spleen: The spleen is on unremarkable.  Adrenals/Urinary Tract: The adrenal glands are both normal. Normal appearance of both kidneys. The urinary bladder appears within normal limits. No hydroureter or hydronephrosis identified. No urinary tract calculi noted.  Stomach/Bowel: The stomach is normal. The small bowel loops have a normal course and caliber. No obstruction. The appendix is visualized and appears normal. Unremarkable appearance of the colon.  Vascular/Lymphatic: Normal appearance of the abdominal aorta. No enlarged retroperitoneal or mesenteric adenopathy. No enlarged pelvic or inguinal lymph nodes.  Reproductive: The uterus and adnexal structures are unremarkable.  Other: There is no ascites or focal fluid collections within the abdomen or  pelvis.  Musculoskeletal: Review of the visualized bony structures is negative. No aggressive lytic or sclerotic bone lesions.  IMPRESSION: 1. No acute findings. No explanation for right flank pain.   Electronically Signed  By: Kerby Moors M.D.  On: 07/07/2015 17:00  Assessment & Plan:    1. Flank pain:   Patient's CT scan is negative for hydronephrosis and stones.  Patient is still experiencing flank pain.  No urological etiology is discovered to explain the pain.  Patient encouraged to follow up with her PCP.  She needs to contact us if she should develop any gross hematuria in the future.    2. History of urinary retention:   Resolved.  Patient is urinating on her own without difficulty.  3. Hydronephrosis:   Resolved.  CT scan did not demonstrate hydronephrosis.    No Follow-up on file.  Zara Council, [facility] [address] [place], [place] [address] [phone]

## 2015-09-28 ENCOUNTER — Ambulatory Visit (INDEPENDENT_AMBULATORY_CARE_PROVIDER_SITE_OTHER): Payer: Medicaid Other | Admitting: Urology

## 2015-09-28 ENCOUNTER — Encounter: Payer: Self-pay | Admitting: Urology

## 2015-09-28 VITALS — BP 121/81 | HR 98 | Ht 70.0 in | Wt 218.4 lb

## 2015-09-28 DIAGNOSIS — N390 Urinary tract infection, site not specified: Secondary | ICD-10-CM | POA: Diagnosis not present

## 2015-09-28 DIAGNOSIS — R109 Unspecified abdominal pain: Secondary | ICD-10-CM

## 2015-09-28 MED ORDER — NITROFURANTOIN MONOHYD MACRO 100 MG PO CAPS: 100.0000 mg | ORAL_CAPSULE | Freq: Two times a day (BID) | ORAL | Status: DC

## 2015-09-28 NOTE — Progress Notes (Signed)
09/28/2015 2:35 PM   Katie Villa January 10, 1981 [id number]  Referring provider: Lorelee Market, MD [place], [place] [address]  Chief Complaint  Patient presents with  . 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 Was not present in July 2016. She also has recurrent UTIs. These are more bladder infections rather than renal infections is no reason on a CAT scan for any kind of obstruction or calculus and I doubt it is changed that much since July. However renal ultrasound has been ordered.     PMH: Past Medical History  Diagnosis Date  . Anemia   . Hydronephrosis   . Acute cystitis with hematuria   . Pelvicaliectasis   . Cleft palate with cleft lip   . IUFD (intrauterine fetal death)   . Gross hematuria     Surgical History: Past Surgical History  Procedure Laterality Date  . Kidney surgery    . Cleft lip repair    . Ectopic pregnancy surgery    . Laparoscopic tubal ligation Bilateral 06/21/2015    Procedure: LAPAROSCOPIC TUBAL LIGATION;  Surgeon: Malachy Mood, MD;  Location: [facility] ORS;  Service: Gynecology;  Laterality: Bilateral;    Home Medications:    Medication List       This list is accurate as of: 09/28/15  2:35 PM.  Always use your most recent med list.               FLEXERIL PO  Take by mouth.     FUSION PLUS PO  Take by mouth.     ibuprofen 600 MG tablet  Commonly known as:  ADVIL,MOTRIN  Take 1 tablet (600 mg total) by mouth every 6 (six) hours.     MOBIC PO  Take by mouth.     nitrofurantoin (macrocrystal-monohydrate) 100 MG capsule  Commonly known as:  MACROBID  Take 1 capsule (100 mg total) by mouth every 12 (twelve) hours.        Allergies: No Known Allergies  Family History: Family History  Problem Relation Age of Onset  . Cancer Mother   . Cancer Maternal Aunt     Social History:  reports that she has never smoked. She does not  have any smokeless tobacco history on file. She reports that she does not drink alcohol or use illicit drugs.  ROS: UROLOGY Frequent Urination?: No Hard to postpone urination?: No Burning/pain with urination?: No Get up at night to urinate?: No Leakage of urine?: No Urine stream starts and stops?: No Trouble starting stream?: No Do you have to strain to urinate?: No Blood in urine?: No Urinary tract infection?: No Sexually transmitted disease?: No Injury to kidneys or bladder?: No Painful intercourse?: No Weak stream?: No Currently pregnant?: No Vaginal bleeding?: No Last menstrual period?: 09-23-15  Gastrointestinal Nausea?: Yes Vomiting?: Yes Indigestion/heartburn?: No Diarrhea?: No Constipation?: No  Constitutional Fever: No Night sweats?: No Weight loss?: No Fatigue?: No  Skin Skin rash/lesions?: No Itching?: No  Eyes Blurred vision?: No Double vision?: No  Ears/Nose/Throat Sore throat?: No Sinus problems?: No  Hematologic/Lymphatic Swollen glands?: No Easy bruising?: No  Cardiovascular Leg swelling?: No Chest pain?: No  Respiratory Cough?: No Shortness of breath?: No  Endocrine Excessive thirst?: No  Musculoskeletal Back pain?: Yes Joint pain?: No  Neurological Headaches?: No Dizziness?: No  Psychologic Depression?: No Anxiety?: No  Physical Exam: BP 121/81 mmHg  Pulse 98  Ht 5\' 10"  (1.778 m)  Wt 218 lb 6.4 oz (99.066 kg)  BMI 31.34 kg/m2  LMP 09/23/2015  Breastfeeding? No  Constitutional:  Alert and oriented, No acute distress. HEENT: [place] AT, moist mucus membranes.  Trachea midline, no masses. Cardiovascular: No clubbing, cyanosis, or edema. Respiratory: Normal respiratory effort, no increased work of breathing. GI: Abdomen is soft, nontender, nondistended, no abdominal masses GU: No CVA tenderness. Intercostal tenderness to inner 11th rib Skin: No rashes, bruises or suspicious lesions. Lymph: No cervical or inguinal  adenopathy. Neurologic: Grossly intact, no focal deficits, moving all 4 extremities. Psychiatric: Normal mood and affect.  Laboratory Data: Lab Results  Component Value Date   WBC 8.3 05/13/2015   HGB 9.6* 05/13/2015   HCT 28.7* 05/13/2015   MCV 84.3 05/13/2015   PLT 157 05/13/2015    Lab Results  Component Value Date   CREATININE 0.77 09/21/2014    No results found for: PSA  No results found for: TESTOSTERONE  No results found for: HGBA1C  Urinalysis    Component Value Date/Time   COLORURINE Yellow 09/21/2014 1234   APPEARANCEUR Cloudy 09/21/2014 1234   LABSPEC 1.015 09/21/2014 1234   PHURINE 8.0 09/21/2014 1234   GLUCOSEU Negative 07/21/2015 0859   GLUCOSEU Negative 09/21/2014 1234   HGBUR Negative 09/21/2014 1234   BILIRUBINUR Negative 07/21/2015 0859   BILIRUBINUR Negative 09/21/2014 1234   KETONESUR Negative 09/21/2014 1234   PROTEINUR Negative 09/21/2014 1234   NITRITE Negative 07/21/2015 0859   NITRITE Negative 09/21/2014 1234   LEUKOCYTESUR Negative 07/21/2015 0859   LEUKOCYTESUR Negative 09/21/2014 1234    Pertinent Imaging: Renal ultrasound ordered  Assessment & Plan:  Continued flank pain and not a calculous pattern. Patient had normal CT scan in July 2016. There were no calculi present no hydronephrosis nor indication of reason that his urologic for her flank pain I will get a ultrasound this patient once had the appearance problem with the UPJ obstruction. Renal ultrasound to make sure they were not missing anything on her left side as a cause of her flank pain she does have a bulging disc 6 at about a well 3 but this is not where her pain pattern  1. Flank pain Musculoskeletal in nature radiating along with 11th rib intercostal space on the left side - US Renal; Future - Urinalysis, Complete; Future  2. Recurrent UTI No UA today as patient had on labeled bottle for UA - nitrofurantoin, macrocrystal-monohydrate, (MACROBID) 100 MG capsule; Take 1  capsule (100 mg total) by mouth every 12 (twelve) hours.  Dispense: 90 capsule; Refill: 2   No Follow-up on file.  Collier Flowers, [facility] [address] [place], [place] [address] [phone]

## 2015-09-29 ENCOUNTER — Other Ambulatory Visit: Payer: Medicaid Other

## 2015-09-29 ENCOUNTER — Other Ambulatory Visit: Payer: Self-pay

## 2015-09-29 LAB — URINALYSIS, COMPLETE
Bilirubin, UA: NEGATIVE
GLUCOSE, UA: NEGATIVE
Ketones, UA: NEGATIVE
Leukocytes, UA: NEGATIVE
NITRITE UA: NEGATIVE
Protein, UA: NEGATIVE
RBC, UA: NEGATIVE
Specific Gravity, UA: 1.01 (ref 1.005–1.030)
Urobilinogen, Ur: 0.2 mg/dL (ref 0.2–1.0)
pH, UA: 7 (ref 5.0–7.5)

## 2015-09-29 LAB — MICROSCOPIC EXAMINATION
BACTERIA UA: NONE SEEN
RBC MICROSCOPIC, UA: NONE SEEN /HPF (ref 0–?)
Renal Epithel, UA: NONE SEEN /hpf

## 2015-09-29 NOTE — Addendum Note (Signed)
Addended by: Tommy Rainwater on: 09/29/2015 09:00 AM   Modules accepted: Orders

## 2015-10-13 ENCOUNTER — Ambulatory Visit: Payer: Medicaid Other | Admitting: Urology

## 2015-10-17 ENCOUNTER — Ambulatory Visit
Admission: RE | Admit: 2015-10-17 | Discharge: 2015-10-17 | Disposition: A | Discharge status: Home / Self Care | Payer: Medicaid Other | Source: Ambulatory Visit | Attending: Urology | Admitting: Urology

## 2015-10-17 DIAGNOSIS — R109 Unspecified abdominal pain: Secondary | ICD-10-CM | POA: Diagnosis present

## 2016-11-08 IMAGING — CT CT RENAL STONE PROTOCOL
2 of 4 series · 16 of 46 positions shown, 18 images · non-contrast
Comparison: None

CLINICAL DATA: Right flank pain

EXAM:
CT ABDOMEN AND PELVIS WITHOUT CONTRAST
TECHNIQUE: Multidetector CT imaging of the abdomen and pelvis was performed
following the standard protocol without IV contrast.

[Series 2: stone · axial · 0.71mm/px · z∈[-980,-540]mm · 13 of 97 slices shown, 15 images]
[im 5/97  soft-tissue]
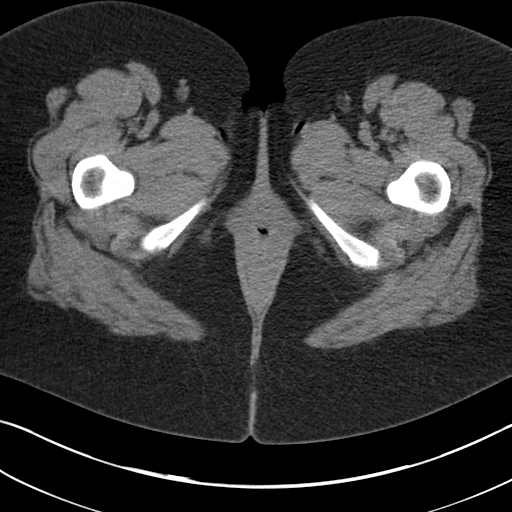
[im 5/97  bone]
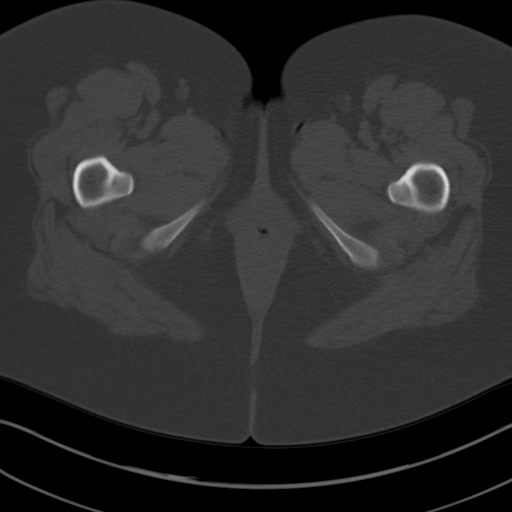
[im 13/97  soft-tissue]
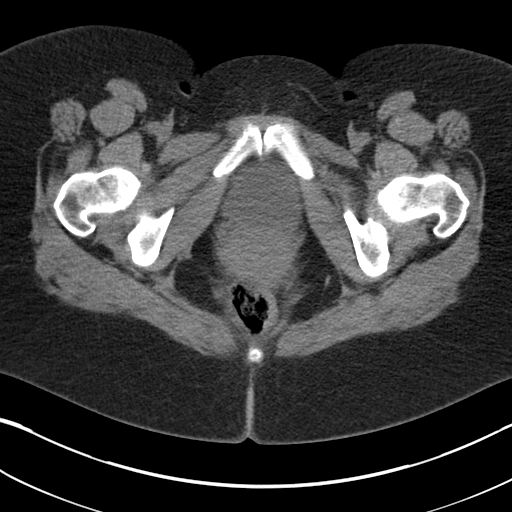
[im 21/97  soft-tissue]
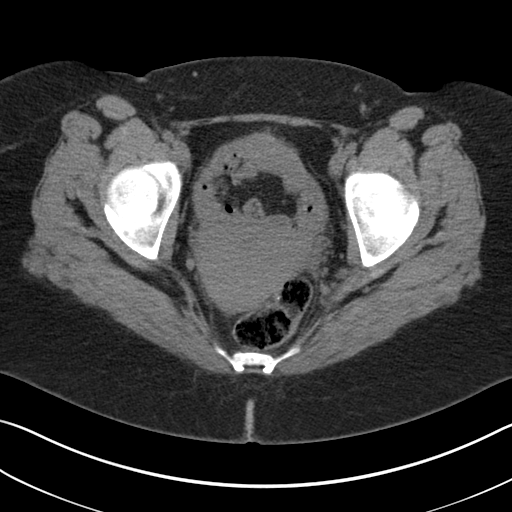
[im 29/97  soft-tissue]
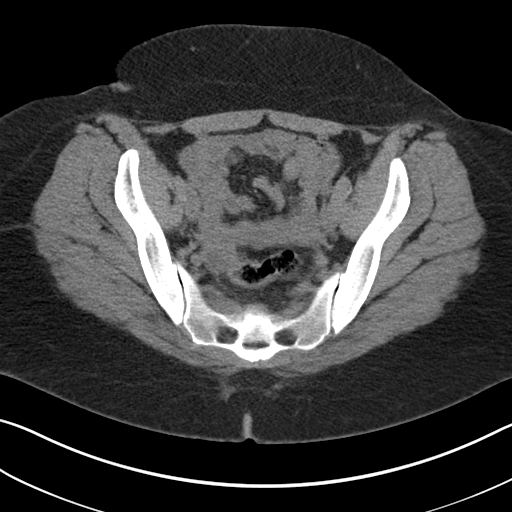
[im 33/97  soft-tissue]
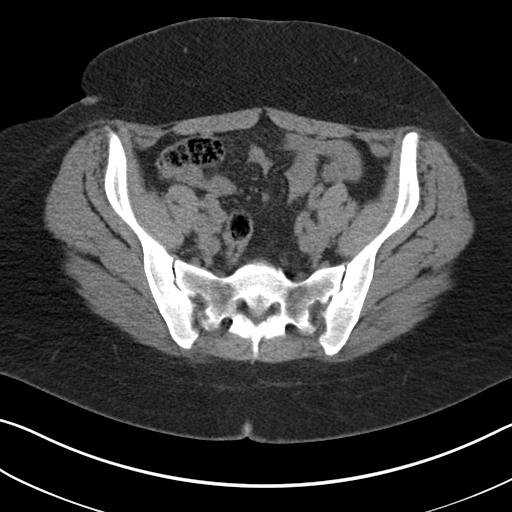
[im 41/97  soft-tissue]
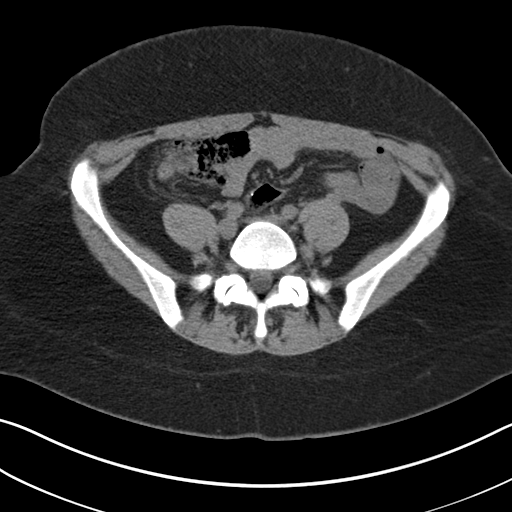
[im 49/97  soft-tissue]
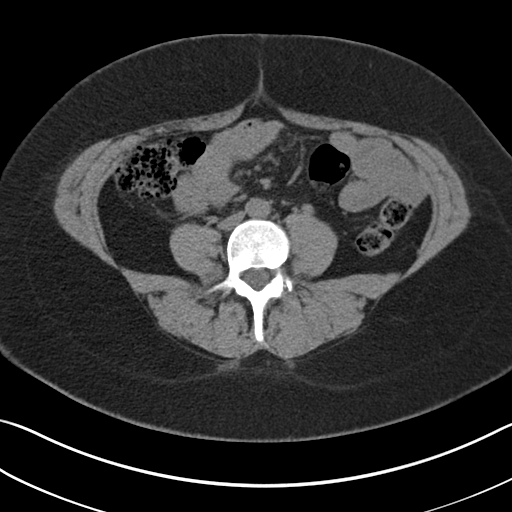
[im 57/97  soft-tissue]
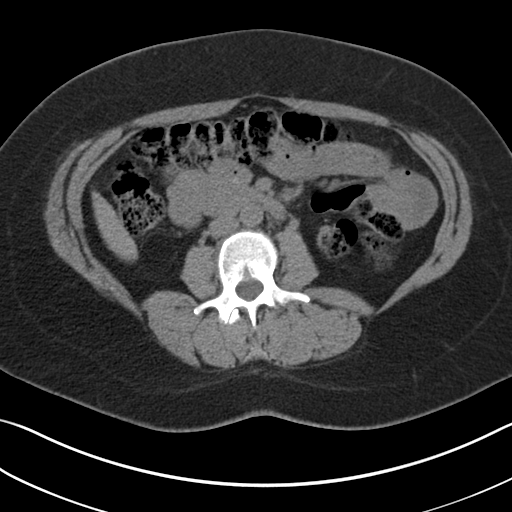
[im 65/97  soft-tissue]
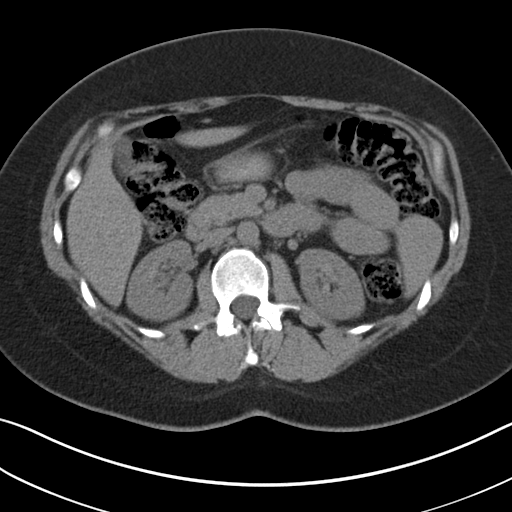
[im 65/97  bone]
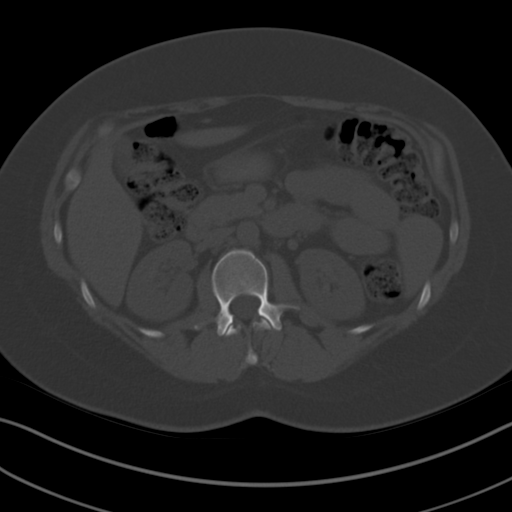
[im 69/97  soft-tissue]
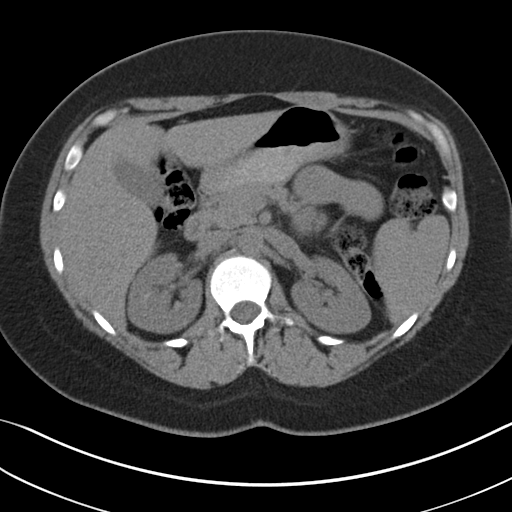
[im 77/97  soft-tissue]
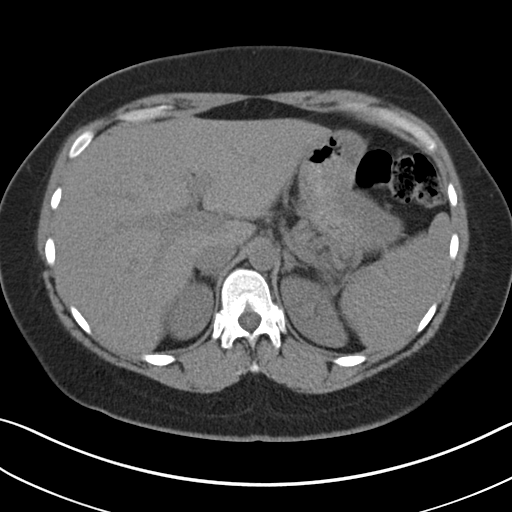
[im 85/97  soft-tissue]
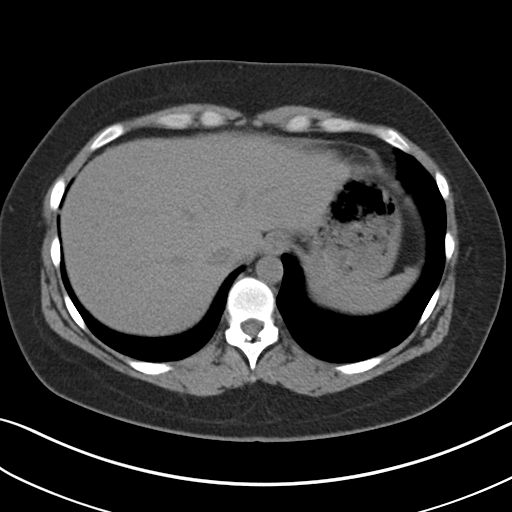
[im 93/97  soft-tissue]
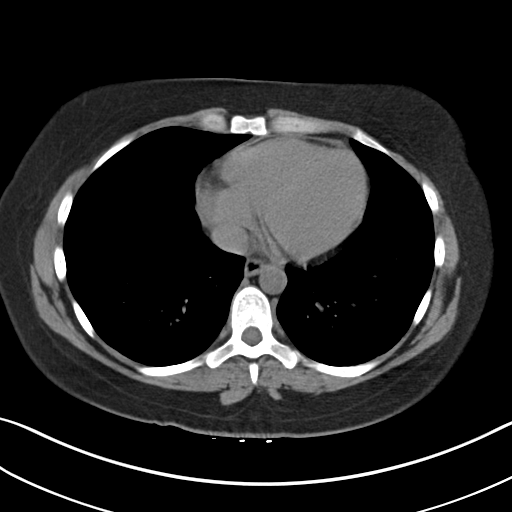

[Series 5: cor · coronal · 0.74mm/px · 3 of 157 slices shown]
[im 53/157  soft-tissue]
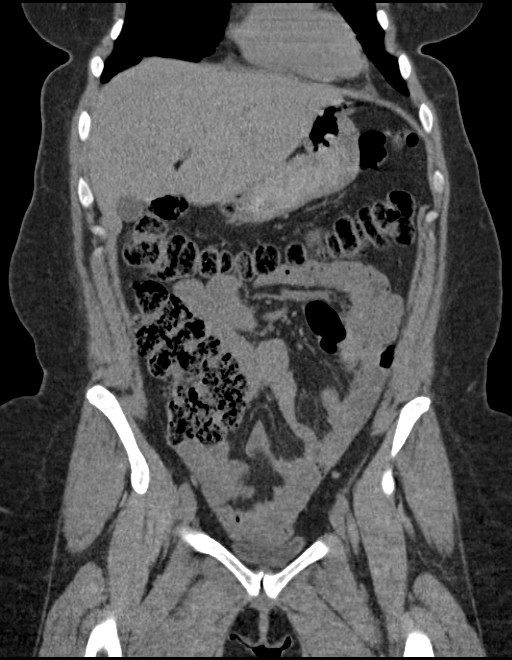
[im 70/157  soft-tissue]
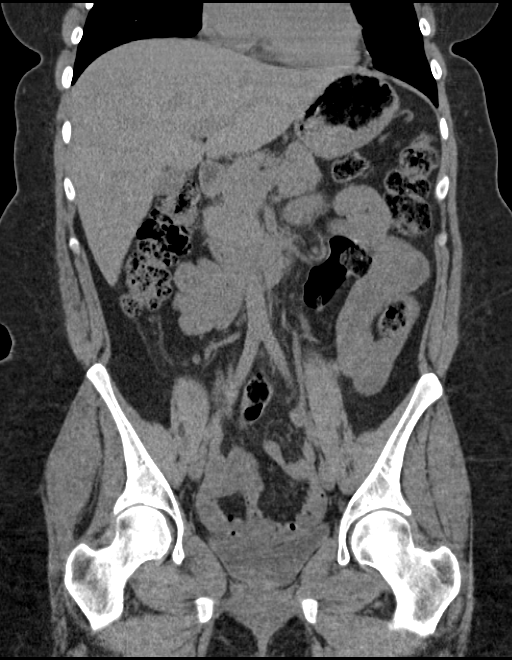
[im 87/157  soft-tissue]
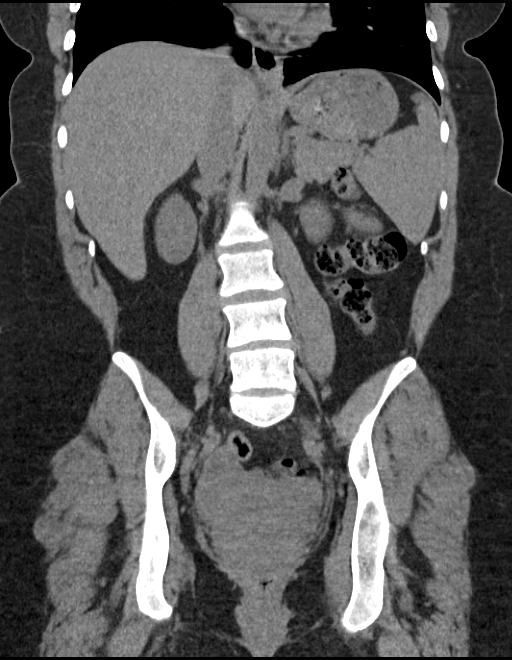

[16 of 46 positions shown; findings below may reference images not displayed]

FINDINGS: Lower chest: There is no pleural effusion identified. The lung bases
appear clear.

Hepatobiliary: There is no focal liver abnormality. The gallbladder
is unremarkable. No biliary dilatation.

Pancreas: Normal appearance of the pancreas.

Spleen: The spleen is on unremarkable.

Adrenals/Urinary Tract: The adrenal glands are both normal. Normal
appearance of both kidneys. The urinary bladder appears within
normal limits. No hydroureter or hydronephrosis identified. No
urinary tract calculi noted.

Stomach/Bowel: The stomach is normal. The small bowel loops have a
normal course and caliber. No obstruction. The appendix is
visualized and appears normal. Unremarkable appearance of the colon.

Vascular/Lymphatic: Normal appearance of the abdominal aorta. No
enlarged retroperitoneal or mesenteric adenopathy. No enlarged
pelvic or inguinal lymph nodes.

Reproductive: The uterus and adnexal structures are unremarkable.

Other: There is no ascites or focal fluid collections within the
abdomen or pelvis.

Musculoskeletal: Review of the visualized bony structures is
negative. No aggressive lytic or sclerotic bone lesions.
IMPRESSION: 1. No acute findings.  No explanation for right flank pain.

## 2017-01-16 ENCOUNTER — Ambulatory Visit: Payer: Medicaid Other

## 2017-01-21 ENCOUNTER — Ambulatory Visit: Payer: Medicaid Other

## 2017-01-23 ENCOUNTER — Encounter: Payer: Self-pay | Admitting: *Deleted

## 2017-01-23 ENCOUNTER — Other Ambulatory Visit: Payer: Self-pay | Admitting: *Deleted

## 2017-01-23 ENCOUNTER — Ambulatory Visit
Admission: RE | Admit: 2017-01-23 | Discharge: 2017-01-23 | Disposition: A | Discharge status: Home / Self Care | Payer: Self-pay | Source: Ambulatory Visit | Attending: Oncology | Admitting: Oncology

## 2017-01-23 ENCOUNTER — Ambulatory Visit: Payer: Self-pay | Attending: Oncology | Admitting: *Deleted

## 2017-01-23 VITALS — BP 120/75 | HR 78 | Temp 97.0°F | Ht 69.29 in | Wt 221.9 lb

## 2017-01-23 DIAGNOSIS — N63 Unspecified lump in unspecified breast: Secondary | ICD-10-CM

## 2017-01-23 DIAGNOSIS — M7989 Other specified soft tissue disorders: Secondary | ICD-10-CM

## 2017-01-23 DIAGNOSIS — N6489 Other specified disorders of breast: Secondary | ICD-10-CM

## 2017-01-23 NOTE — Progress Notes (Signed)
Subjective:     Patient ID: Katie Villa, female   DOB: 08/19/1981, 36 y.o.   MRN: [id number]  HPI   Review of Systems     Objective:   Physical Exam  Pulmonary/Chest: Right breast exhibits no inverted nipple, no mass, no nipple discharge, no skin change and no tenderness. Left breast exhibits no inverted nipple, no mass, no nipple discharge, no skin change and no tenderness. Breasts are symmetrical.       Assessment:     36 year old White female referred to [facility] by Threasa Alpha, PA for acute lymphangitis of the right axilla. Patient states she felt a thickening with swelling from the axilla to the right elbow about 2 months ago.  Threasa Alpha noted slight edema and tenderness in the right axilla.   Patient with significant family history of cancer.  A maternal aunt with breast at age 8.  Both her aunt and her cousin are BRCA positive.  Patient's mom was diagnosed with a sarcoma.  States her mom did not have genetic testing.  Encouraged patient to consider genetic testing.  On clinical breast exam there is no dominant mass, skin changes, lymphadenopathy or nipple discharge.  Patient also complains that she has pain along the right groin.  No palpable lymphadenopathy along the right groin.  Patient has been screened for eligibility.  She does not have any insurance, Medicare or Medicaid.  She also meets financial eligibility.  Hand-out given on the Affordable Care Act.    Plan:     Will go ahead and get bilateral diagnostic mammogram and ultrasound.  Will further discuss genetic testing after results of her mammogram.  Will follow-up per [facility] protocol.

## 2017-01-23 NOTE — Patient Instructions (Signed)
Gave patient hand-out, Women Staying Healthy, Active and Well from [facility], with education on breast health, pap smears, heart and colon health. 

## 2017-01-24 ENCOUNTER — Other Ambulatory Visit: Payer: Self-pay | Admitting: *Deleted

## 2017-02-04 ENCOUNTER — Ambulatory Visit
Admission: RE | Admit: 2017-02-04 | Discharge: 2017-02-04 | Disposition: A | Discharge status: Home / Self Care | Payer: Self-pay | Source: Ambulatory Visit | Attending: Oncology | Admitting: Oncology

## 2017-02-04 DIAGNOSIS — N6489 Other specified disorders of breast: Secondary | ICD-10-CM

## 2017-02-04 DIAGNOSIS — N63 Unspecified lump in unspecified breast: Secondary | ICD-10-CM

## 2017-02-04 HISTORY — PX: BREAST BIOPSY: SHX20

## 2017-02-05 LAB — SURGICAL PATHOLOGY

## 2017-02-15 ENCOUNTER — Encounter: Payer: Self-pay | Admitting: *Deleted

## 2017-02-15 NOTE — Progress Notes (Signed)
Patient informed of her benign biopsy by radiology.  She is to follow with annual screening based on guidelines and family history.  HSIS to [place].

## 2017-09-21 IMAGING — US US BREAST*R* LIMITED INC AXILLA
1 series · 12 of 12 positions shown · non-contrast
Comparison: None.

CLINICAL DATA: Patient describes right axillary swelling.

Family history of breast cancer and family members with BRCA gene
mutation.
This is patient's baseline mammogram.
EXAM:
2D DIGITAL DIAGNOSTIC BILATERAL MAMMOGRAM WITH CAD AND ADJUNCT TOMO
ULTRASOUND RIGHT BREAST

[Series 1: us breast*right* limited inc axilla · 0.04mm/px · 12 of 12 slices shown]
[im 1/12]
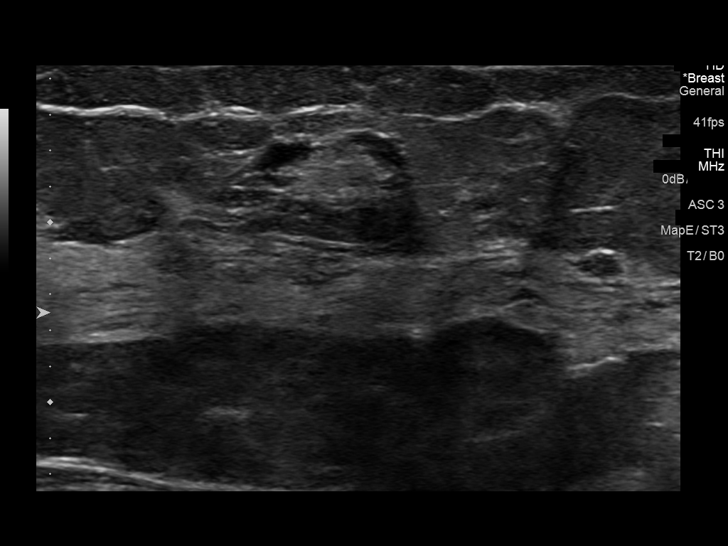
[im 2/12]
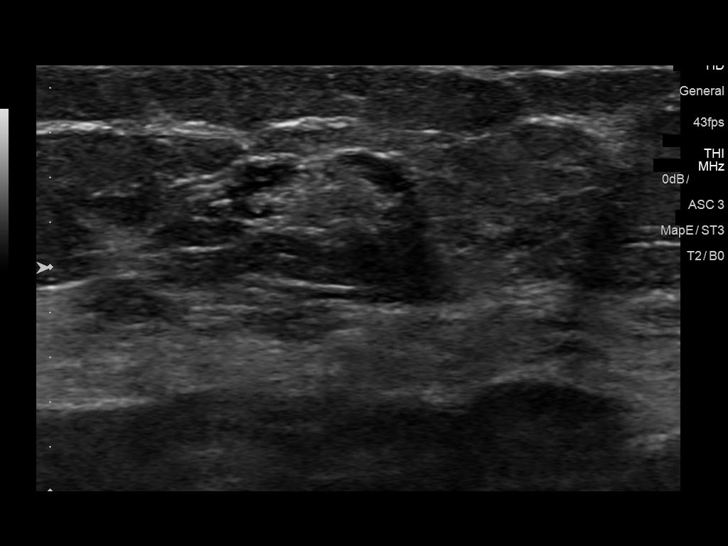
[im 3/12]
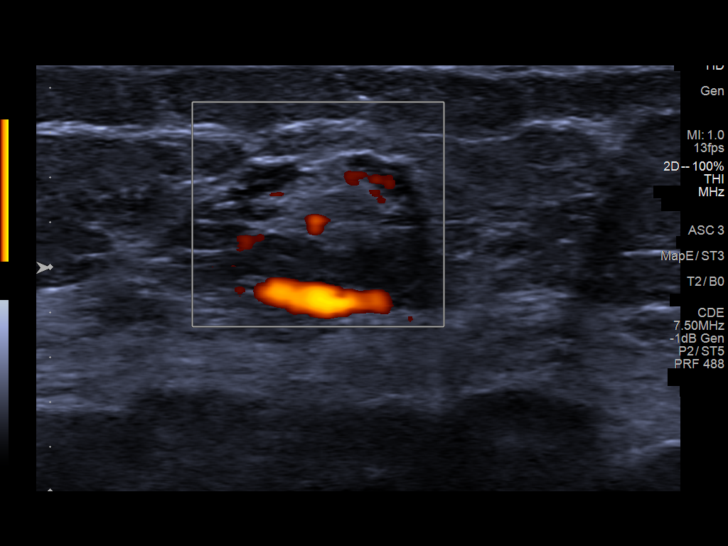
[im 4/12]
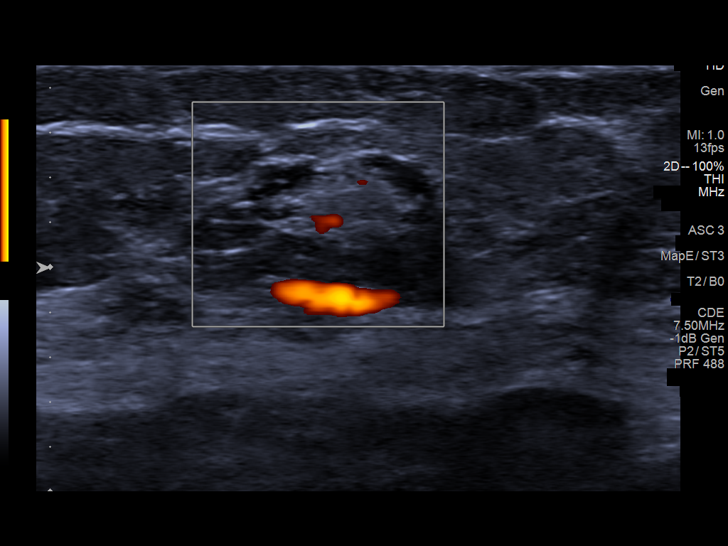
[im 5/12]
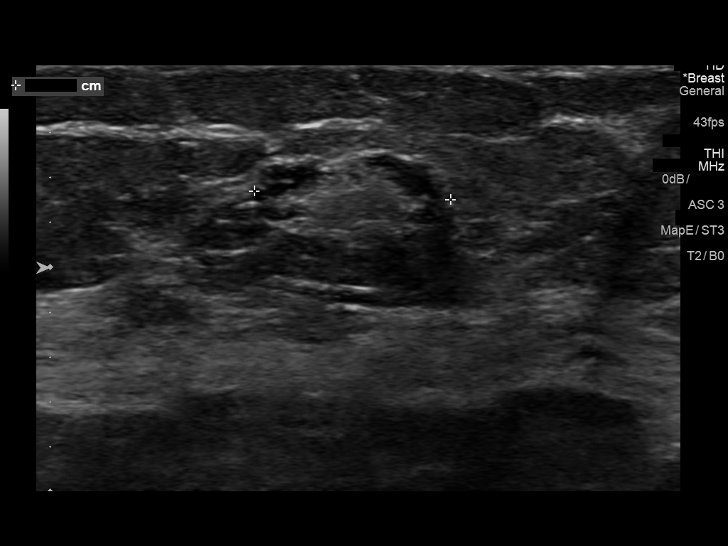
[im 6/12]
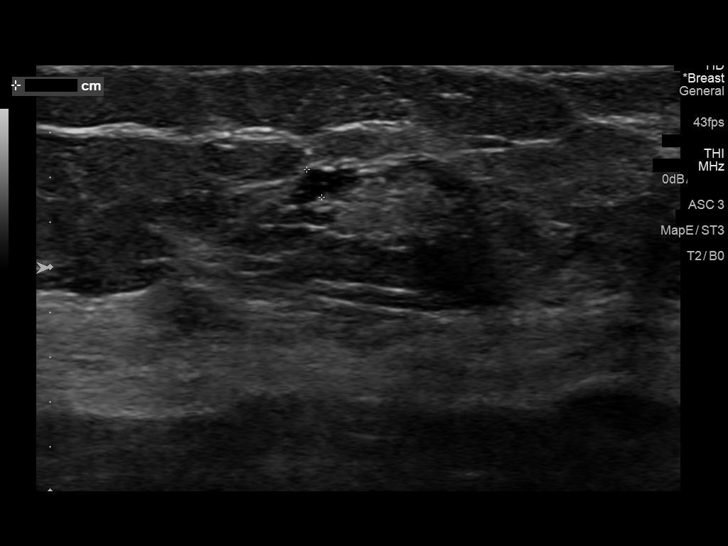
[im 7/12]
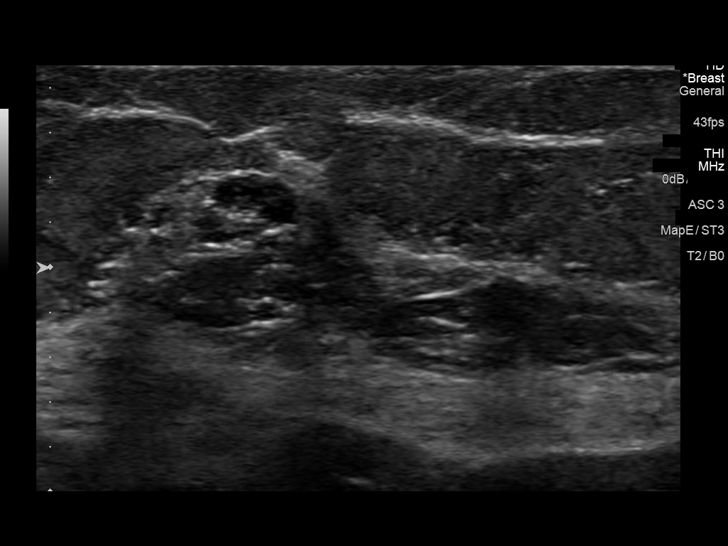
[im 8/12]
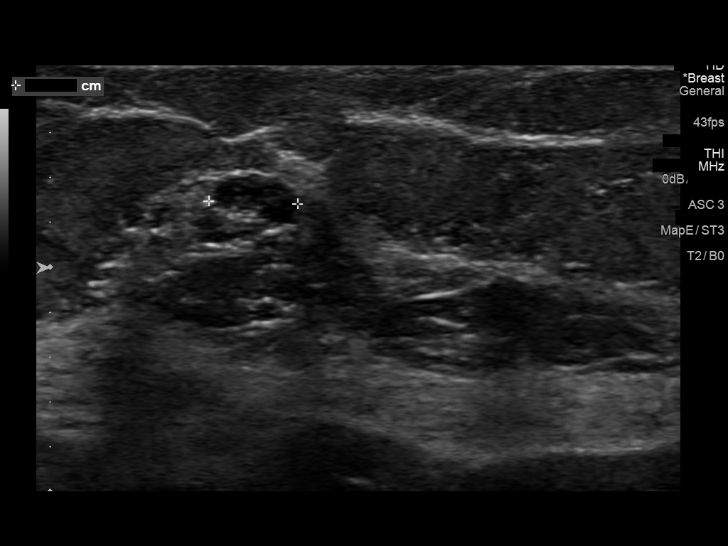
[im 9/12]
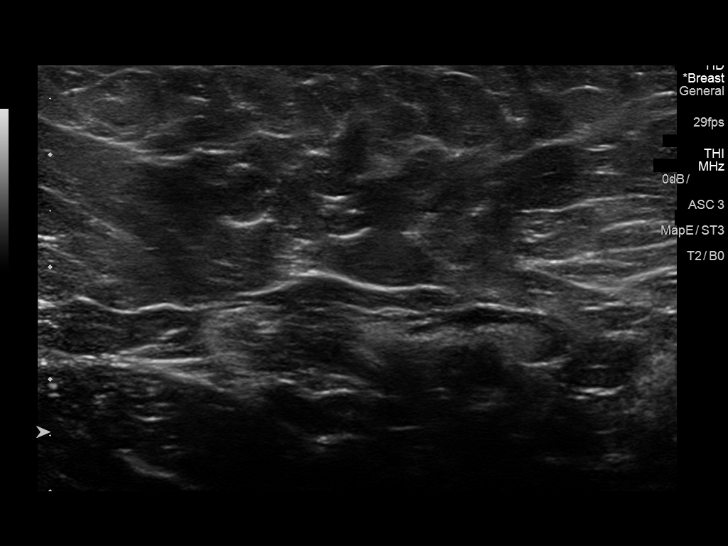
[im 10/12]
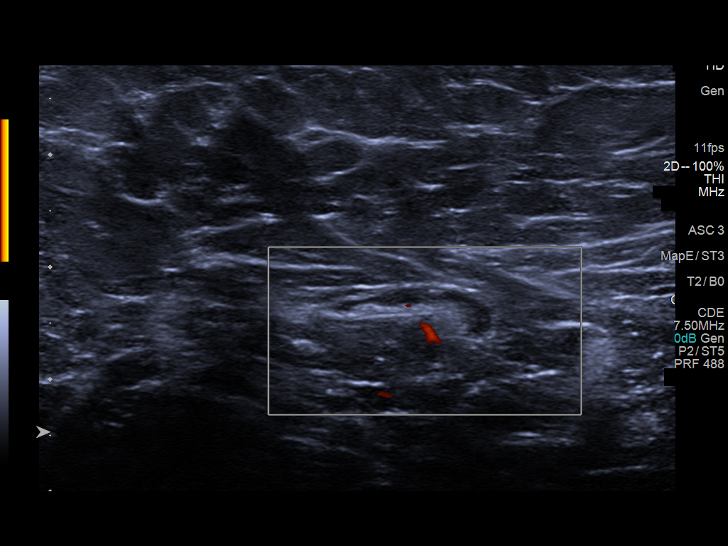
[im 11/12]
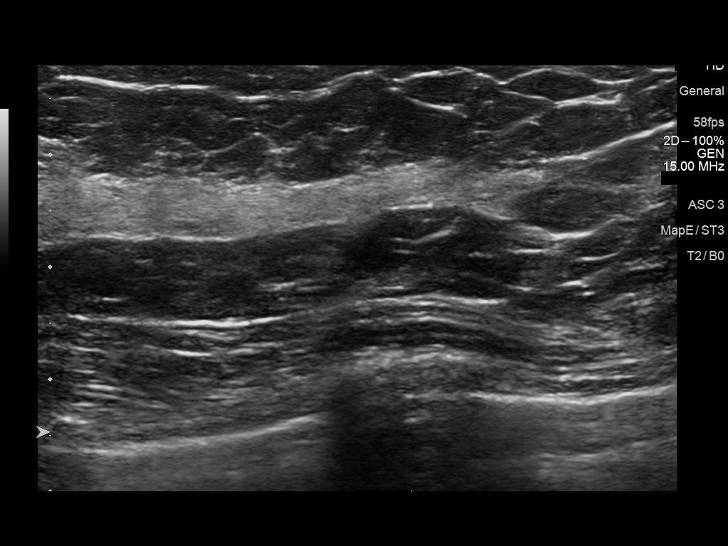
[im 12/12]
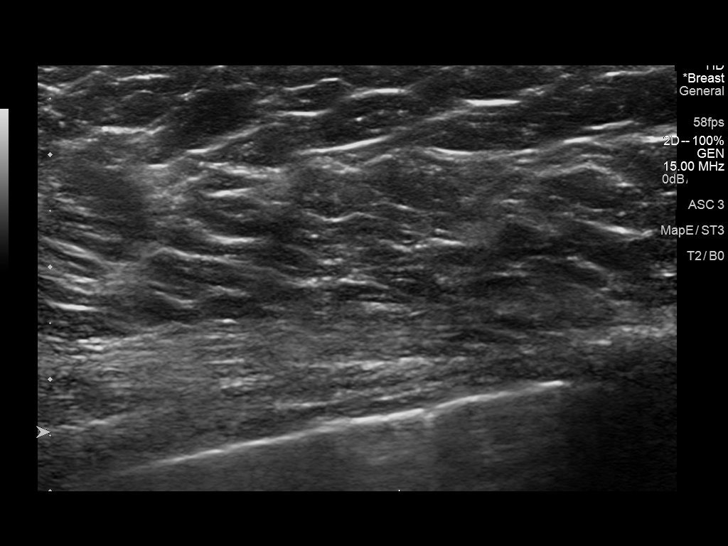

[12 of 12 positions shown; findings below may reference images not displayed]

ACR Breast Density Category b: There are scattered areas of
fibroglandular density.
FINDINGS: There is a questionable distortion within the outer RIGHT breast,
only seen on the CC projection, tomosynthesis slice 33. The
distortion is not convincingly resolved on exaggerated CC view or
spot compression view. Benign-appearing lymph node identified in the
upper-outer quadrant of the right breast.

There are no dominant masses, suspicious calcifications or secondary
signs of malignancy within the left breast.

Mammographic images were processed with CAD.

On physical exam, there is no palpable mass within the outer right
breast. There is soft tissue fullness within the right axilla
without evidence of circumscribed mass.

Targeted ultrasound is performed, evaluating the outer right breast
with particular attention to the 8-10 o'clock axes corresponding to
the area of mammographic finding, showing only normal fibroglandular
tissues and fat lobules throughout. There is a ridge of normal dense
fibroglandular tissue at the 10 o'clock axis. No suspicious solid or
cystic masses identified.

Incidental note is made of a benign-appearing lymph node at the 10
o'clock axis, 4 cm from the nipple, corresponding to the
benign-appearing lymph node seen on mammogram

Next, targeted ultrasound was performed of the right axilla showing
only normal soft tissues and normal appearing lymph nodes. No
suspicious solid or cystic masses identified. No enlarged or
morphologically abnormal lymph nodes identified.
IMPRESSION: 1. Questionable architectural distortion within the outer RIGHT
breast, seen on the CC projection only, for which a stereotactic
biopsy with 3D tomosynthesis guidance is recommended.
2. No evidence of malignancy within the right axilla.
3. No evidence of malignancy within the left breast.

Ordering physician will be contacted with today's results and
patient will then be scheduled for stereotactic biopsy at her
earliest convenience.

RECOMMENDATION:
Stereotactic biopsy with 3D tomosynthesis guidance for the
questionable distortion in the outer RIGHT breast.

I have discussed the findings and recommendations with the patient.
Results were also provided in writing at the conclusion of the
visit. If applicable, a reminder letter will be sent to the patient
regarding the next appointment.

BI-RADS CATEGORY  4: Suspicious.

## 2017-11-28 ENCOUNTER — Ambulatory Visit (INDEPENDENT_AMBULATORY_CARE_PROVIDER_SITE_OTHER): Payer: 59 | Admitting: Obstetrics and Gynecology

## 2017-11-28 ENCOUNTER — Encounter: Payer: Self-pay | Admitting: Obstetrics and Gynecology

## 2017-11-28 VITALS — BP 118/78 | HR 64 | Ht 70.0 in | Wt 217.0 lb

## 2017-11-28 DIAGNOSIS — R102 Pelvic and perineal pain: Secondary | ICD-10-CM

## 2017-11-28 DIAGNOSIS — D259 Leiomyoma of uterus, unspecified: Secondary | ICD-10-CM | POA: Diagnosis not present

## 2017-11-28 DIAGNOSIS — Z124 Encounter for screening for malignant neoplasm of cervix: Secondary | ICD-10-CM | POA: Diagnosis not present

## 2017-11-30 LAB — PAPIG, HPV, RFX 16/18
HPV, HIGH-RISK: NEGATIVE
PAP SMEAR COMMENT: 0

## 2017-12-01 ENCOUNTER — Encounter: Payer: Self-pay | Admitting: Obstetrics and Gynecology

## 2017-12-01 DIAGNOSIS — D259 Leiomyoma of uterus, unspecified: Secondary | ICD-10-CM | POA: Insufficient documentation

## 2017-12-01 DIAGNOSIS — R102 Pelvic and perineal pain: Secondary | ICD-10-CM | POA: Insufficient documentation

## 2017-12-01 NOTE — Progress Notes (Signed)
Negative pap, repeat in 5 years Called home number but was told this was the wrong number

## 2017-12-01 NOTE — Progress Notes (Signed)
Gynecology Pelvic Pain Evaluation   Chief Complaint:  Chief Complaint  Patient presents with  . Referral    referral for fibroids    History of Present Illness:   Patient is a 36 y.o. [id number] who LMP was Patient's last menstrual period was 11/22/2017 (exact date), presents today for a problem visit.  She complains of pelvic pain.   Her pain is localized to the suprapubic area, described as sharp stabbing, began 6 months ago and its severity is described as severe. The pain radiates to the  right back. She has these symptoms the week before, during, and after her period. She says her periods have become more painful than they ever were. She has been having heavier than normal bleeding during her period requiring her to change her pad ebery hour. She denies dizziness or lightheadedness. She has been unable to have intercourse because of the pain. She has tried massage, relaxation, NSAIDs, and hot showers with no improvement. She had an ultrasound in November 2018 that showed a  3.5x2.9cm fibroid in the lower uterine segment. Uterus 9.6x4.3x5cm.   PMHx: She  has a past medical history of Acute cystitis with hematuria, Anemia, Cleft palate with cleft lip, Gross hematuria, Hydronephrosis, IUFD (intrauterine fetal death), and Pelvicaliectasis. Also,  has a past surgical history that includes Kidney surgery; Cleft lip repair; Ectopic pregnancy surgery; Laparoscopic tubal ligation (Bilateral, 06/21/2015); Breast biopsy (Right, 02/04/2017); and Tubal ligation., family history includes Breast cancer (age of onset: 27) in her maternal aunt; Cancer in her maternal aunt and mother.,  reports that  has never smoked. she has never used smokeless tobacco. She reports that she does not drink alcohol or use drugs.  She currently has no medications in their medication list. Also, has No Known Allergies.  OB History    Gravida Para Term Preterm AB Living   5 4 3 1 1 3    SAB TAB Ectopic Multiple Live Births    1 0 3     Gynecological History Menarche at 67, regular cycles every 28 days No history of sexually transmitted infections History of ectopic pregnancy and salpingectomy   Review of Systems  Constitutional: Negative for chills, fever, malaise/fatigue and weight loss.  HENT: Negative for congestion, hearing loss and sinus pain.   Eyes: Negative for blurred vision and double vision.  Respiratory: Negative for cough, sputum production, shortness of breath and wheezing.   Cardiovascular: Negative for chest pain, palpitations, orthopnea and leg swelling.  Gastrointestinal: Negative for abdominal pain, constipation, diarrhea, nausea and vomiting.  Genitourinary: Negative for dysuria, flank pain, frequency, hematuria and urgency.  Musculoskeletal: Negative for back pain, falls and joint pain.  Skin: Negative for itching and rash.  Neurological: Negative for dizziness and headaches.  Psychiatric/Behavioral: Negative for depression, substance abuse and suicidal ideas. The patient is not nervous/anxious.     Objective: BP 118/78   Pulse 64   Ht 5\' 10"  (1.778 m)   Wt 217 lb (98.4 kg)   LMP 11/22/2017 (Exact Date)   BMI 31.14 kg/m  Physical Exam  Constitutional: She is oriented to person, place, and time. She appears well-developed and well-nourished.  Genitourinary: Vagina normal. Pelvic exam was performed with patient supine. There is no lesion on the right labia. There is no lesion on the left labia. Vagina exhibits no lesion.  Right adnexum displays tenderness. Right adnexum does not display mass and does not display fullness.  Left adnexum displays tenderness. Left adnexum does not display mass and does not  display fullness. Cervix does not exhibit motion tenderness.   Uterus is tender and irregular.  HENT:  Head: Normocephalic and atraumatic.  Eyes: EOM are normal.  Neck: Neck supple. No thyromegaly present.  Cardiovascular: Normal rate, regular rhythm and normal heart sounds.    Pulmonary/Chest: Effort normal and breath sounds normal. Right breast exhibits no inverted nipple, no mass, no nipple discharge and no skin change. Left breast exhibits no inverted nipple, no mass, no nipple discharge and no skin change.  Abdominal: Soft. Bowel sounds are normal. She exhibits no distension and no mass.  Neurological: She is alert and oriented to person, place, and time.  Skin: Skin is warm and dry.  Psychiatric: She has a normal mood and affect. Her behavior is normal. Judgment and thought content normal.  Vitals reviewed.   Female chaperone present for pelvic portion of the physical exam  Assessment: 36 y.o. [id number] with pelvic pain likely secondary to fibroid uterus.  1. Fibroid Uterus Discussed with patient options for Myosure, myomectomy, uterine artery embolization, and hysterectomy. Patient desires to have a hysterectomy. Will follow up for preoperative consultation and endometrial biopsy.   2. Screening for cervical cancer - PapIG, HPV, rfx 16/18  Problem List Items Addressed This Visit    None    Visit Diagnoses    Screening for cervical cancer    -  Primary   Relevant Orders   PapIG, HPV, rfx 16/18 (Completed)      Adrian Prows, MD [place] 12/01/2017  1:14 PM

## 2017-12-05 ENCOUNTER — Ambulatory Visit (INDEPENDENT_AMBULATORY_CARE_PROVIDER_SITE_OTHER): Payer: 59 | Admitting: Obstetrics and Gynecology

## 2017-12-05 ENCOUNTER — Telehealth: Payer: Self-pay | Admitting: Obstetrics and Gynecology

## 2017-12-05 ENCOUNTER — Encounter: Payer: Self-pay | Admitting: Obstetrics and Gynecology

## 2017-12-05 VITALS — BP 108/68 | HR 79 | Ht 70.0 in | Wt 216.0 lb

## 2017-12-05 DIAGNOSIS — N939 Abnormal uterine and vaginal bleeding, unspecified: Secondary | ICD-10-CM

## 2017-12-05 DIAGNOSIS — D251 Intramural leiomyoma of uterus: Secondary | ICD-10-CM | POA: Diagnosis not present

## 2017-12-05 DIAGNOSIS — R87612 Low grade squamous intraepithelial lesion on cytologic smear of cervix (LGSIL): Secondary | ICD-10-CM | POA: Insufficient documentation

## 2017-12-05 NOTE — Progress Notes (Signed)
Patient ID: Katie Villa, female   DOB: Jan 07, 1981, 36 y.o.   MRN: [id number]  Reason for Consult: endometrial biopsy (preoperative consultation)   Referred by Lorelee Market, MD  Subjective:     HPI:  Katie Villa is a 36 y.o. female here today for a preoperative consultation. She has a fibroid uterus that is causing her to have heavy menstrual bleeding. She declines medical management options. The patient desires to have a total laparoscopic hysterectomy with salpingectomy. The risks and benefits of this procedure were reviewed with the patient.   Past Medical History:  Diagnosis Date  . Acute cystitis with hematuria   . Anemia   . Cleft palate with cleft lip   . Gross hematuria   . Hydronephrosis   . IUFD (intrauterine fetal death)   . Pelvicaliectasis    Family History  Problem Relation Age of Onset  . Cancer Mother   . Cancer Maternal Aunt   . Breast cancer Maternal Aunt 39   Past Surgical History:  Procedure Laterality Date  . BREAST BIOPSY Right 02/04/2017   path pending  . CLEFT LIP REPAIR    . ECTOPIC PREGNANCY SURGERY    . KIDNEY SURGERY    . LAPAROSCOPIC TUBAL LIGATION Bilateral 06/21/2015   Procedure: LAPAROSCOPIC TUBAL LIGATION;  Surgeon: Malachy Mood, MD;  Location: [facility] ORS;  Service: Gynecology;  Laterality: Bilateral;  . TUBAL LIGATION      Short Social History:  Social History   Tobacco Use  . Smoking status: Never Smoker  . Smokeless tobacco: Never Used  Substance Use Topics  . Alcohol use: No    No Known Allergies  No current outpatient medications on file.   No current facility-administered medications for this visit.     Review of Systems  Constitutional: Negative for chills, fatigue, fever and unexpected weight change.  HENT: Negative for trouble swallowing.  Eyes: Negative for loss of vision.  Respiratory: Negative for cough, shortness of breath and wheezing.  Cardiovascular: Negative for chest pain, leg swelling,  palpitations and syncope.  GI: Negative for abdominal pain, blood in stool, diarrhea, nausea and vomiting.  GU: Negative for difficulty urinating, dysuria, frequency and hematuria.  Musculoskeletal: Negative for back pain, leg pain and joint pain.  Skin: Negative for rash.  Neurological: Negative for dizziness, headaches, light-headedness, numbness and seizures.  Psychiatric: Negative for behavioral problem, confusion, depressed mood and sleep disturbance.        Objective:  Objective   Vitals:   12/05/17 1006  BP: 108/68  Pulse: 79  Weight: 216 lb (98 kg)  Height: 5\' 10"  (1.778 m)   Body mass index is 30.99 kg/m.  Physical Exam  Constitutional: She is oriented to person, place, and time. She appears well-developed.  HENT:  Head: Normocephalic and atraumatic.  Eyes: Conjunctivae and EOM are normal. Pupils are equal, round, and reactive to light.  Neck: Normal range of motion. Neck supple. No thyromegaly present.  Cardiovascular: Normal rate, regular rhythm and normal heart sounds.  Pulmonary/Chest: Effort normal and breath sounds normal.  Abdominal: Soft. Bowel sounds are normal.  Genitourinary: Vagina normal.  Genitourinary Comments: Enlarged fibroid uterus, tender in the midline.  Musculoskeletal: Normal range of motion.  Neurological: She is alert and oriented to person, place, and time.  Skin: Skin is warm and dry.  Psychiatric: She has a normal mood and affect. Her behavior is normal. Judgment and thought content normal.   Endometrial Biopsy After discussion with the patient regarding her abnormal  uterine bleeding I recommended that she proceed with an endometrial biopsy for further diagnosis. The risks, benefits, alternatives, and indications for an endometrial biopsy were discussed with the patient in detail. She understood the risks including infection, bleeding, cervical laceration and uterine perforation.  Verbal consent was obtained.   PROCEDURE NOTE:  Pipelle  endometrial biopsy was performed using aseptic technique with iodine preparation.  The uterus was sounded to a length of 10 cm.  Adequate sampling was obtained with minimal blood loss.  The patient tolerated the procedure well.  Disposition will be pending pathology.      Assessment/Plan:     Endometrial biopsy performed today.  Surgical consents signed. Will proceed in scheduling total laparoscopic hysterectomy and salpingectomy.  Homero Fellers MD 12/08/17 11:15 PM

## 2017-12-05 NOTE — Telephone Encounter (Signed)
Patient is aware of Pre-admit Testing phone interview on 12/13/17 9am-1pm, and OR on 12/20/17. Ext given.

## 2017-12-05 NOTE — H&P (View-Only) (Signed)
Patient ID: Katie Villa, female   DOB: [birth year], 36 y.o.   MRN: [id number]  Reason for Consult: endometrial biopsy (preoperative consultation)   Referred by Lorelee Market, MD  Subjective:     HPI:  Katie Villa is a 36 y.o. female here today for a preoperative consultation. She has a fibroid uterus that is causing her to have heavy menstrual bleeding. She declines medical management options. The patient desires to have a total laparoscopic hysterectomy with salpingectomy. The risks and benefits of this procedure were reviewed with the patient.   Past Medical History:  Diagnosis Date  . Acute cystitis with hematuria   . Anemia   . Cleft palate with cleft lip   . Gross hematuria   . Hydronephrosis   . IUFD (intrauterine fetal death)   . Pelvicaliectasis    Family History  Problem Relation Age of Onset  . Cancer Mother   . Cancer Maternal Aunt   . Breast cancer Maternal Aunt 39   Past Surgical History:  Procedure Laterality Date  . BREAST BIOPSY Right 02/04/2017   path pending  . CLEFT LIP REPAIR    . ECTOPIC PREGNANCY SURGERY    . KIDNEY SURGERY    . LAPAROSCOPIC TUBAL LIGATION Bilateral 06/21/2015   Procedure: LAPAROSCOPIC TUBAL LIGATION;  Surgeon: Malachy Mood, MD;  Location: [facility] ORS;  Service: Gynecology;  Laterality: Bilateral;  . TUBAL LIGATION      Short Social History:  Social History   Tobacco Use  . Smoking status: Never Smoker  . Smokeless tobacco: Never Used  Substance Use Topics  . Alcohol use: No    No Known Allergies  No current outpatient medications on file.   No current facility-administered medications for this visit.     Review of Systems  Constitutional: Negative for chills, fatigue, fever and unexpected weight change.  HENT: Negative for trouble swallowing.  Eyes: Negative for loss of vision.  Respiratory: Negative for cough, shortness of breath and wheezing.  Cardiovascular: Negative for chest pain, leg swelling,  palpitations and syncope.  GI: Negative for abdominal pain, blood in stool, diarrhea, nausea and vomiting.  GU: Negative for difficulty urinating, dysuria, frequency and hematuria.  Musculoskeletal: Negative for back pain, leg pain and joint pain.  Skin: Negative for rash.  Neurological: Negative for dizziness, headaches, light-headedness, numbness and seizures.  Psychiatric: Negative for behavioral problem, confusion, depressed mood and sleep disturbance.        Objective:  Objective   Vitals:   12/05/17 1006  BP: 108/68  Pulse: 79  Weight: 216 lb (98 kg)  Height: 5\' 10"  (1.778 m)   Body mass index is 30.99 kg/m.  Physical Exam  Constitutional: She is oriented to person, place, and time. She appears well-developed.  HENT:  Head: Normocephalic and atraumatic.  Eyes: Conjunctivae and EOM are normal. Pupils are equal, round, and reactive to light.  Neck: Normal range of motion. Neck supple. No thyromegaly present.  Cardiovascular: Normal rate, regular rhythm and normal heart sounds.  Pulmonary/Chest: Effort normal and breath sounds normal.  Abdominal: Soft. Bowel sounds are normal.  Genitourinary: Vagina normal.  Genitourinary Comments: Enlarged fibroid uterus, tender in the midline.  Musculoskeletal: Normal range of motion.  Neurological: She is alert and oriented to person, place, and time.  Skin: Skin is warm and dry.  Psychiatric: She has a normal mood and affect. Her behavior is normal. Judgment and thought content normal.   Endometrial Biopsy After discussion with the patient regarding her abnormal  uterine bleeding I recommended that she proceed with an endometrial biopsy for further diagnosis. The risks, benefits, alternatives, and indications for an endometrial biopsy were discussed with the patient in detail. She understood the risks including infection, bleeding, cervical laceration and uterine perforation.  Verbal consent was obtained.   PROCEDURE NOTE:  Pipelle  endometrial biopsy was performed using aseptic technique with iodine preparation.  The uterus was sounded to a length of 10 cm.  Adequate sampling was obtained with minimal blood loss.  The patient tolerated the procedure well.  Disposition will be pending pathology.      Assessment/Plan:     Endometrial biopsy performed today.  Surgical consents signed. Will proceed in scheduling total laparoscopic hysterectomy and salpingectomy.  Homero Fellers MD 12/08/17 11:15 PM

## 2017-12-05 NOTE — Telephone Encounter (Signed)
Lmtrc

## 2017-12-05 NOTE — Telephone Encounter (Signed)
-----   Message from Homero Fellers, MD sent at 12/05/2017 10:40 AM EST ----- Regarding: Surgery Schedule Surgery Booking Request Patient Full Name:  Katie Villa MRN: [id number]  DOB: 1981/03/01  Surgeon: Homero Fellers, MD  Requested Surgery Date and Time: 12/20/17 Primary Diagnosis AND Code: Fibroid uterus Secondary Diagnosis and Code:  Surgical Procedure: TLH, salpingectomy and cystoscopy L&D Notification: No Admission Status: same day surgery Length of Surgery: 3 hours Special Case Needs: cysto H&P: 12/05/17 (date) Phone Interview???: yes Interpreter: Language:  Medical Clearance: no Special Scheduling Instructions: none

## 2017-12-09 ENCOUNTER — Telehealth: Payer: Self-pay | Admitting: Obstetrics and Gynecology

## 2017-12-09 LAB — PATHOLOGY

## 2017-12-09 NOTE — Telephone Encounter (Signed)
I contacted the patient to give her info from Ely B., and patient is aware of no pre-existing conditions for 12 months, policy effective 0/6/26, Christella Scheuermann will pay for CPT 58571 up to $2800 and CPT 52000 up to $320, patient can call [employer] to move up to another plan with more coverage.

## 2017-12-10 NOTE — Progress Notes (Signed)
Normal, called and left voicemail to check MyChart

## 2017-12-12 ENCOUNTER — Encounter: Payer: Self-pay | Admitting: Obstetrics and Gynecology

## 2017-12-13 ENCOUNTER — Encounter
Admission: RE | Admit: 2017-12-13 | Discharge: 2017-12-13 | Disposition: A | Discharge status: Home / Self Care | Payer: 59 | Source: Ambulatory Visit | Attending: Obstetrics and Gynecology | Admitting: Obstetrics and Gynecology

## 2017-12-13 ENCOUNTER — Other Ambulatory Visit: Payer: Self-pay

## 2017-12-13 NOTE — Patient Instructions (Signed)
  Your procedure is scheduled on: 12-20-17 FRIDAY Report to Same Day Surgery 2nd floor medical mall [facility] Entrance-take elevator on left to 2nd floor.  Check in with surgery information desk.) To find out your arrival time please call [phone] between 1PM - 3PM on 12-19-17 THURSDAY  Remember: Instructions that are not followed completely may result in serious medical risk, up to and including death, or upon the discretion of your surgeon and anesthesiologist your surgery may need to be rescheduled.    _x___ 1. Do not eat food after midnight the night before your procedure. NO GUM OR CANDY AFTER MIDNIGHT.  You may drink clear liquids up to 2 hours before you are scheduled to arrive at the hospital for your procedure.  Do not drink clear liquids within 2 hours of your scheduled arrival to the hospital.  Clear liquids include  --Water or Apple juice without pulp  --Clear carbohydrate beverage such as ClearFast or Gatorade  --Black Coffee or Clear Tea (No milk, no creamers, do not add anything to the coffee or Tea      __x__ 2. No Alcohol for 24 hours before or after surgery.   __x__3. No Smoking for 24 prior to surgery.   ____  4. Bring all medications with you on the day of surgery if instructed.    __x__ 5. Notify your doctor if there is any change in your medical condition     (cold, fever, infections).     Do not wear jewelry, make-up, hairpins, clips or nail polish.  Do not wear lotions, powders, or perfumes. You may wear deodorant.  Do not shave 48 hours prior to surgery. Men may shave face and neck.  Do not bring valuables to the hospital.    [facility] is not responsible for any belongings or valuables.               Contacts, dentures or bridgework may not be worn into surgery.  Leave your suitcase in the car. After surgery it may be brought to your room.  For patients admitted to the hospital, discharge time is determined by your treatment team.   Patients  discharged the day of surgery will not be allowed to drive home.  You will need someone to drive you home and stay with you the night of your procedure.    Please read over the following fact sheets that you were given:   [facility] Preparing for Surgery and or MRSA Information   ____ Take anti-hypertensive listed below, cardiac, seizure, asthma, anti-reflux and psychiatric medicines. These include:  1. NONE  2.  3.  4.  5.  6.  ____Fleets enema or Magnesium Citrate as directed.   _x___ Use CHG Soap or sage wipes as directed on instruction sheet   ____ Use inhalers on the day of surgery and bring to hospital day of surgery  ____ Stop Metformin and Janumet 2 days prior to surgery.    ____ Take 1/2 of usual insulin dose the night before surgery and none on the morning surgery.   ____ Follow recommendations from Cardiologist, Pulmonologist or PCP regarding stopping Aspirin, Coumadin, Plavix ,Eliquis, Effient, or Pradaxa, and Pletal.  X____Stop Anti-inflammatories such as Advil, Aleve, Ibuprofen, Motrin, Naproxen, Naprosyn, Goodies powders or aspirin products NOW-OK to take Tylenol    ____ Stop supplements until after surgery.    ____ Bring C-Pap to the hospital.

## 2017-12-18 ENCOUNTER — Encounter
Admission: RE | Admit: 2017-12-18 | Discharge: 2017-12-18 | Disposition: A | Discharge status: Home / Self Care | Payer: 59 | Source: Ambulatory Visit | Attending: Obstetrics and Gynecology | Admitting: Obstetrics and Gynecology

## 2017-12-18 DIAGNOSIS — D259 Leiomyoma of uterus, unspecified: Secondary | ICD-10-CM | POA: Diagnosis present

## 2017-12-18 DIAGNOSIS — N72 Inflammatory disease of cervix uteri: Secondary | ICD-10-CM | POA: Diagnosis not present

## 2017-12-18 DIAGNOSIS — D649 Anemia, unspecified: Secondary | ICD-10-CM | POA: Diagnosis not present

## 2017-12-18 LAB — CBC
HEMATOCRIT: 33 % — AB (ref 35.0–47.0)
HEMOGLOBIN: 10.7 g/dL — AB (ref 12.0–16.0)
MCH: 23.4 pg — ABNORMAL LOW (ref 26.0–34.0)
MCHC: 32.4 g/dL (ref 32.0–36.0)
MCV: 72.4 fL — AB (ref 80.0–100.0)
Platelets: 297 10*3/uL (ref 150–440)
RBC: 4.55 MIL/uL (ref 3.80–5.20)
RDW: 16.5 % — AB (ref 11.5–14.5)
WBC: 4.7 10*3/uL (ref 3.6–11.0)

## 2017-12-18 LAB — TYPE AND SCREEN
ABO/RH(D): O POS
Antibody Screen: NEGATIVE

## 2017-12-19 ENCOUNTER — Telehealth: Payer: Self-pay | Admitting: Obstetrics and Gynecology

## 2017-12-19 ENCOUNTER — Telehealth: Payer: Self-pay

## 2017-12-19 MED ORDER — DEXTROSE 5 % IV SOLN
2.0000 g | INTRAVENOUS | Status: AC
  Administered 2017-12-20: 2 g via INTRAVENOUS
  Filled 2017-12-19: qty 2

## 2017-12-19 NOTE — Telephone Encounter (Signed)
Okay thank you

## 2017-12-19 NOTE — Telephone Encounter (Signed)
Pt scheduled for hysterectomy w/[facility] tomorrow and has questions. [phone]

## 2017-12-19 NOTE — Telephone Encounter (Signed)
Called back, answered questions

## 2017-12-19 NOTE — Telephone Encounter (Signed)
Heather from pre admit is calling about patients order for surgery are not in Epic. Please complete

## 2017-12-20 ENCOUNTER — Ambulatory Visit: Payer: 59 | Admitting: Anesthesiology

## 2017-12-20 ENCOUNTER — Encounter
Admission: RE | Disposition: A | Discharge status: Home / Self Care | Payer: Self-pay | Source: Ambulatory Visit | Attending: Obstetrics and Gynecology

## 2017-12-20 ENCOUNTER — Encounter: Payer: Self-pay | Admitting: *Deleted

## 2017-12-20 ENCOUNTER — Observation Stay
Admission: RE | Admit: 2017-12-20 | Discharge: 2017-12-21 | Disposition: A | Discharge status: Home / Self Care | Payer: 59 | Source: Ambulatory Visit | Attending: Obstetrics and Gynecology | Admitting: Obstetrics and Gynecology

## 2017-12-20 DIAGNOSIS — N72 Inflammatory disease of cervix uteri: Secondary | ICD-10-CM | POA: Insufficient documentation

## 2017-12-20 DIAGNOSIS — D259 Leiomyoma of uterus, unspecified: Principal | ICD-10-CM | POA: Insufficient documentation

## 2017-12-20 DIAGNOSIS — Z90710 Acquired absence of both cervix and uterus: Secondary | ICD-10-CM | POA: Diagnosis present

## 2017-12-20 DIAGNOSIS — D649 Anemia, unspecified: Secondary | ICD-10-CM | POA: Insufficient documentation

## 2017-12-20 HISTORY — PX: TOTAL LAPAROSCOPIC HYSTERECTOMY WITH SALPINGECTOMY: SHX6742

## 2017-12-20 LAB — POCT PREGNANCY, URINE: PREG TEST UR: NEGATIVE

## 2017-12-20 SURGERY — HYSTERECTOMY, TOTAL, LAPAROSCOPIC, WITH SALPINGECTOMY
Anesthesia: General | Wound class: Clean Contaminated

## 2017-12-20 MED ORDER — FAMOTIDINE 20 MG PO TABS
20.0000 mg | ORAL_TABLET | Freq: Once | ORAL | Status: AC
  Administered 2017-12-20: 20 mg via ORAL

## 2017-12-20 MED ORDER — ONDANSETRON HCL 4 MG/2ML IJ SOLN
INTRAMUSCULAR | Status: AC
  Filled 2017-12-20: qty 2

## 2017-12-20 MED ORDER — LIDOCAINE HCL (PF) 1 % IJ SOLN
INTRAMUSCULAR | Status: AC
  Filled 2017-12-20: qty 30

## 2017-12-20 MED ORDER — ROCURONIUM BROMIDE 50 MG/5ML IV SOLN
INTRAVENOUS | Status: AC
  Filled 2017-12-20: qty 1

## 2017-12-20 MED ORDER — HYDROCODONE-ACETAMINOPHEN 5-325 MG PO TABS: 1.0000 | ORAL_TABLET | ORAL | 0 refills | Status: DC | PRN

## 2017-12-20 MED ORDER — ONDANSETRON HCL 4 MG/2ML IJ SOLN: 4.0000 mg | Freq: Once | INTRAMUSCULAR | Status: DC | PRN

## 2017-12-20 MED ORDER — MIDAZOLAM HCL 2 MG/2ML IJ SOLN
INTRAMUSCULAR | Status: AC
  Filled 2017-12-20: qty 2

## 2017-12-20 MED ORDER — FAMOTIDINE 20 MG PO TABS
ORAL_TABLET | ORAL | Status: AC
  Administered 2017-12-20: 20 mg via ORAL
  Filled 2017-12-20: qty 1

## 2017-12-20 MED ORDER — HYDROCODONE-ACETAMINOPHEN 5-325 MG PO TABS
1.0000 | ORAL_TABLET | ORAL | Status: DC | PRN
  Administered 2017-12-20: 2 via ORAL

## 2017-12-20 MED ORDER — ONDANSETRON HCL 4 MG/2ML IJ SOLN
INTRAMUSCULAR | Status: AC
Start: 2017-12-20 — End: ?
  Filled 2017-12-20: qty 2

## 2017-12-20 MED ORDER — MIDAZOLAM HCL 2 MG/2ML IJ SOLN
INTRAMUSCULAR | Status: DC | PRN
  Administered 2017-12-20: 2 mg via INTRAVENOUS

## 2017-12-20 MED ORDER — DEXAMETHASONE SODIUM PHOSPHATE 10 MG/ML IJ SOLN
INTRAMUSCULAR | Status: AC
  Filled 2017-12-20: qty 1

## 2017-12-20 MED ORDER — FENTANYL CITRATE (PF) 100 MCG/2ML IJ SOLN
INTRAMUSCULAR | Status: AC
  Filled 2017-12-20: qty 2

## 2017-12-20 MED ORDER — SOD CITRATE-CITRIC ACID 500-334 MG/5ML PO SOLN
30.0000 mL | ORAL | Status: DC
  Filled 2017-12-20: qty 30

## 2017-12-20 MED ORDER — GLYCOPYRROLATE 0.2 MG/ML IJ SOLN
INTRAMUSCULAR | Status: DC | PRN
  Administered 2017-12-20: 0.2 mg via INTRAVENOUS

## 2017-12-20 MED ORDER — DIPHENHYDRAMINE HCL 50 MG/ML IJ SOLN
25.0000 mg | Freq: Once | INTRAMUSCULAR | Status: AC
Start: 2017-12-20 — End: 2017-12-20
  Administered 2017-12-20: 25 mg via INTRAVENOUS
  Filled 2017-12-20: qty 0.5

## 2017-12-20 MED ORDER — LACTATED RINGERS IV SOLN: INTRAVENOUS | Status: DC

## 2017-12-20 MED ORDER — ONDANSETRON HCL 4 MG/2ML IJ SOLN
INTRAMUSCULAR | Status: DC | PRN
  Administered 2017-12-20: 4 mg via INTRAVENOUS

## 2017-12-20 MED ORDER — DIPHENHYDRAMINE HCL 50 MG/ML IJ SOLN
INTRAMUSCULAR | Status: AC
  Administered 2017-12-20: 25 mg via INTRAVENOUS
  Filled 2017-12-20: qty 1

## 2017-12-20 MED ORDER — ACETAMINOPHEN 10 MG/ML IV SOLN
INTRAVENOUS | Status: AC
  Filled 2017-12-20: qty 100

## 2017-12-20 MED ORDER — FENTANYL CITRATE (PF) 100 MCG/2ML IJ SOLN
INTRAMUSCULAR | Status: DC | PRN
  Administered 2017-12-20 (×4): 50 ug via INTRAVENOUS
  Administered 2017-12-20: 100 ug via INTRAVENOUS

## 2017-12-20 MED ORDER — ONDANSETRON HCL 4 MG/2ML IJ SOLN: 4.0000 mg | Freq: Four times a day (QID) | INTRAMUSCULAR | Status: DC | PRN

## 2017-12-20 MED ORDER — HYDROMORPHONE HCL 1 MG/ML IJ SOLN
INTRAMUSCULAR | Status: AC
  Administered 2017-12-20: 0.5 mg via INTRAVENOUS
  Filled 2017-12-20: qty 1

## 2017-12-20 MED ORDER — PHENYLEPHRINE HCL 10 MG/ML IJ SOLN
INTRAMUSCULAR | Status: DC | PRN
  Administered 2017-12-20: 100 ug via INTRAVENOUS

## 2017-12-20 MED ORDER — HYDROMORPHONE HCL 1 MG/ML IJ SOLN
0.5000 mg | INTRAMUSCULAR | Status: DC | PRN
  Administered 2017-12-20 (×2): 0.5 mg via INTRAVENOUS

## 2017-12-20 MED ORDER — HYDROCODONE-ACETAMINOPHEN 5-325 MG PO TABS
ORAL_TABLET | ORAL | Status: AC
  Administered 2017-12-20: 2 via ORAL
  Filled 2017-12-20: qty 2

## 2017-12-20 MED ORDER — SUGAMMADEX SODIUM 200 MG/2ML IV SOLN
INTRAVENOUS | Status: DC | PRN
  Administered 2017-12-20: 200 mg via INTRAVENOUS

## 2017-12-20 MED ORDER — LACTATED RINGERS IV SOLN
INTRAVENOUS | Status: DC
  Administered 2017-12-20: 13:00:00 via INTRAVENOUS

## 2017-12-20 MED ORDER — SIMETHICONE 80 MG PO CHEW: 80.0000 mg | CHEWABLE_TABLET | Freq: Four times a day (QID) | ORAL | Status: DC | PRN

## 2017-12-20 MED ORDER — FENTANYL CITRATE (PF) 100 MCG/2ML IJ SOLN
INTRAMUSCULAR | Status: AC
  Administered 2017-12-20: 25 ug via INTRAVENOUS
  Filled 2017-12-20: qty 2

## 2017-12-20 MED ORDER — FENTANYL CITRATE (PF) 100 MCG/2ML IJ SOLN
25.0000 ug | INTRAMUSCULAR | Status: AC | PRN
  Administered 2017-12-20 (×2): 25 ug via INTRAVENOUS

## 2017-12-20 MED ORDER — ACETAMINOPHEN 10 MG/ML IV SOLN
INTRAVENOUS | Status: DC | PRN
  Administered 2017-12-20: 1000 mg via INTRAVENOUS

## 2017-12-20 MED ORDER — DEXAMETHASONE SODIUM PHOSPHATE 10 MG/ML IJ SOLN
INTRAMUSCULAR | Status: DC | PRN
  Administered 2017-12-20: 10 mg via INTRAVENOUS

## 2017-12-20 MED ORDER — HYDROMORPHONE HCL 1 MG/ML IJ SOLN
0.2000 mg | INTRAMUSCULAR | Status: DC | PRN
  Administered 2017-12-20: 0.2 mg via INTRAVENOUS
  Administered 2017-12-20: 0.4 mg via INTRAVENOUS
  Filled 2017-12-20 (×2): qty 1

## 2017-12-20 MED ORDER — LIDOCAINE HCL 1 % IJ SOLN
INTRAMUSCULAR | Status: DC | PRN
  Administered 2017-12-20: 15 mL

## 2017-12-20 MED ORDER — PROPOFOL 10 MG/ML IV BOLUS
INTRAVENOUS | Status: DC | PRN
  Administered 2017-12-20: 150 mg via INTRAVENOUS

## 2017-12-20 MED ORDER — IBUPROFEN 600 MG PO TABS: 600.0000 mg | ORAL_TABLET | Freq: Four times a day (QID) | ORAL | 3 refills | Status: DC | PRN

## 2017-12-20 MED ORDER — PROPOFOL 10 MG/ML IV BOLUS
INTRAVENOUS | Status: AC
Start: 2017-12-20 — End: ?
  Filled 2017-12-20: qty 20

## 2017-12-20 MED ORDER — DOCUSATE SODIUM 100 MG PO CAPS
100.0000 mg | ORAL_CAPSULE | Freq: Two times a day (BID) | ORAL | Status: DC
  Administered 2017-12-20: 100 mg via ORAL
  Filled 2017-12-20: qty 1

## 2017-12-20 MED ORDER — IBUPROFEN 800 MG PO TABS
800.0000 mg | ORAL_TABLET | Freq: Three times a day (TID) | ORAL | Status: DC | PRN
  Administered 2017-12-20 – 2017-12-21 (×2): 800 mg via ORAL
  Filled 2017-12-20 (×2): qty 1

## 2017-12-20 MED ORDER — SUGAMMADEX SODIUM 200 MG/2ML IV SOLN
INTRAVENOUS | Status: AC
  Filled 2017-12-20: qty 2

## 2017-12-20 MED ORDER — FENTANYL CITRATE (PF) 100 MCG/2ML IJ SOLN
25.0000 ug | INTRAMUSCULAR | Status: AC | PRN
  Administered 2017-12-20 (×6): 25 ug via INTRAVENOUS

## 2017-12-20 MED ORDER — ONDANSETRON HCL 4 MG PO TABS: 4.0000 mg | ORAL_TABLET | Freq: Four times a day (QID) | ORAL | Status: DC | PRN

## 2017-12-20 MED ORDER — ROCURONIUM BROMIDE 100 MG/10ML IV SOLN
INTRAVENOUS | Status: DC | PRN
Start: 2017-12-20 — End: 2017-12-20
  Administered 2017-12-20: 20 mg via INTRAVENOUS
  Administered 2017-12-20: 10 mg via INTRAVENOUS
  Administered 2017-12-20: 50 mg via INTRAVENOUS

## 2017-12-20 MED ORDER — HYDROCODONE-ACETAMINOPHEN 5-325 MG PO TABS
1.0000 | ORAL_TABLET | ORAL | Status: DC | PRN
  Administered 2017-12-21 (×2): 2 via ORAL
  Filled 2017-12-20 (×2): qty 2

## 2017-12-20 SURGICAL SUPPLY — 69 items
ADH SKN CLS APL DERMABOND .7 (GAUZE/BANDAGES/DRESSINGS) ×2
APL SRG 38 LTWT LNG FL B (MISCELLANEOUS) ×1
APPLICATOR ARISTA FLEXITIP XL (MISCELLANEOUS) ×1 IMPLANT
BAG URINE DRAINAGE (UROLOGICAL SUPPLIES) ×2 IMPLANT
BLADE SURG SZ11 CARB STEEL (BLADE) ×2 IMPLANT
CANISTER SUCT 1200ML W/VALVE (MISCELLANEOUS) ×2 IMPLANT
CATH FOLEY 2WAY  5CC 16FR (CATHETERS) ×1
CATH FOLEY 2WAY 5CC 16FR (CATHETERS) ×1
CATH URTH 16FR FL 2W BLN LF (CATHETERS) ×1 IMPLANT
CHLORAPREP W/TINT 26ML (MISCELLANEOUS) ×2 IMPLANT
DEFOGGER SCOPE WARMER CLEARIFY (MISCELLANEOUS) ×2 IMPLANT
DERMABOND ADVANCED (GAUZE/BANDAGES/DRESSINGS) ×2
DERMABOND ADVANCED .7 DNX12 (GAUZE/BANDAGES/DRESSINGS) ×1 IMPLANT
DEVICE SUTURE ENDOST 10MM (ENDOMECHANICALS) ×2 IMPLANT
DRAPE CAMERA CLOSED 9X96 (DRAPES) ×2 IMPLANT
DRESSING SURGICEL FIBRLLR 1X2 (HEMOSTASIS) IMPLANT
DRSG SURGICEL FIBRILLAR 1X2 (HEMOSTASIS) ×2
DRSG TEGADERM 2-3/8X2-3/4 SM (GAUZE/BANDAGES/DRESSINGS) IMPLANT
ENDOSTITCH 0 SINGLE 48 (SUTURE) IMPLANT
FILTER LAP SMOKE EVAC STRL (MISCELLANEOUS) ×1 IMPLANT
GAUZE SPONGE NON-WVN 2X2 STRL (MISCELLANEOUS) IMPLANT
GLOVE BIO SURGEON STRL SZ8 (GLOVE) ×10 IMPLANT
GLOVE INDICATOR 8.0 STRL GRN (GLOVE) ×2 IMPLANT
GLOVE SURG SYN 6.5 ES PF (GLOVE) ×4 IMPLANT
GLOVE SURG SYN 6.5 PF PI (GLOVE) IMPLANT
GLOVE SURG SYN 7.0 (GLOVE) ×2 IMPLANT
GLOVE SURG SYN 7.0 PF PI (GLOVE) IMPLANT
GOWN STRL REUS W/ TWL LRG LVL3 (GOWN DISPOSABLE) ×1 IMPLANT
GOWN STRL REUS W/ TWL XL LVL3 (GOWN DISPOSABLE) ×2 IMPLANT
GOWN STRL REUS W/TWL LRG LVL3 (GOWN DISPOSABLE) ×2
GOWN STRL REUS W/TWL XL LVL3 (GOWN DISPOSABLE) ×4
GRASPER SUT TROCAR 14GX15 (MISCELLANEOUS) ×2 IMPLANT
HEMOSTAT ARISTA ABSORB 1G (MISCELLANEOUS) ×1 IMPLANT
IRRIGATION STRYKERFLOW (MISCELLANEOUS) ×1 IMPLANT
IRRIGATOR STRYKERFLOW (MISCELLANEOUS) ×2
IV LACTATED RINGERS 1000ML (IV SOLUTION) ×4 IMPLANT
KIT PINK PAD W/HEAD ARE REST (MISCELLANEOUS) ×2
KIT PINK PAD W/HEAD ARM REST (MISCELLANEOUS) ×1 IMPLANT
KIT RM TURNOVER CYSTO AR (KITS) ×2 IMPLANT
LABEL OR SOLS (LABEL) ×2 IMPLANT
LIGASURE LAP MARYLAND 5MM 37CM (ELECTROSURGICAL) ×1 IMPLANT
MANIPULATOR VCARE LG CRV RETR (MISCELLANEOUS) ×1 IMPLANT
MANIPULATOR VCARE SML CRV RETR (MISCELLANEOUS) IMPLANT
MANIPULATOR VCARE STD CRV RETR (MISCELLANEOUS) IMPLANT
NEEDLE HYPO 22GX1.5 SAFETY (NEEDLE) ×1 IMPLANT
NEEDLE VERESS 14GA 120MM (NEEDLE) ×1 IMPLANT
NS IRRIG 500ML POUR BTL (IV SOLUTION) ×2 IMPLANT
OCCLUDER COLPOPNEUMO (BALLOONS) ×2 IMPLANT
PACK GYN LAPAROSCOPIC (MISCELLANEOUS) ×2 IMPLANT
PAD OB MATERNITY 4.3X12.25 (PERSONAL CARE ITEMS) ×2 IMPLANT
PAD PREP 24X41 OB/GYN DISP (PERSONAL CARE ITEMS) ×2 IMPLANT
SCISSORS METZENBAUM CVD 33 (INSTRUMENTS) IMPLANT
SET CYSTO W/LG BORE CLAMP LF (SET/KITS/TRAYS/PACK) ×2 IMPLANT
SHEARS HARMONIC ACE PLUS 36CM (ENDOMECHANICALS) ×2 IMPLANT
SLEEVE ENDOPATH XCEL 5M (ENDOMECHANICALS) ×2 IMPLANT
SPONGE VERSALON 2X2 STRL (MISCELLANEOUS)
SUT ENDO VLOC 180-0-8IN (SUTURE) ×2 IMPLANT
SUT MNCRL 4-0 (SUTURE) ×2
SUT MNCRL 4-0 27XMFL (SUTURE) ×1
SUT VIC AB 0 CT1 36 (SUTURE) ×2 IMPLANT
SUT VIC AB 0 CT2 27 (SUTURE) ×1 IMPLANT
SUT VIC AB 4-0 FS2 27 (SUTURE) ×2 IMPLANT
SUT VIC AB 4-0 PS2 18 (SUTURE) ×1 IMPLANT
SUTURE MNCRL 4-0 27XMF (SUTURE) IMPLANT
SYR 10ML LL (SYRINGE) ×2 IMPLANT
SYR 50ML LL SCALE MARK (SYRINGE) ×2 IMPLANT
TROCAR ENDO BLADELESS 11MM (ENDOMECHANICALS) ×2 IMPLANT
TROCAR XCEL NON-BLD 5MMX100MML (ENDOMECHANICALS) ×2 IMPLANT
TUBING INSUF HEATED (TUBING) ×2 IMPLANT

## 2017-12-20 NOTE — Transfer of Care (Signed)
Immediate Anesthesia Transfer of Care Note  Patient: Katie Villa  Procedure(s) Performed: TOTAL LAPAROSCOPIC HYSTERECTOMY WITH SALPINGECTOMY, CYSTOSCOPY (N/A )  Patient Location: PACU  Anesthesia Type:General  Level of Consciousness: drowsy and patient cooperative  Airway & Oxygen Therapy: Patient Spontanous Breathing and Patient connected to face mask oxygen  Post-op Assessment: Report given to RN and Post -op Vital signs reviewed and stable  Post vital signs: Reviewed and stable  Last Vitals:  Vitals:   12/20/17 1240 12/20/17 1644  BP: 115/74 109/64  Pulse: 69 73  Resp: 20 17  Temp: 36.6 C   SpO2: 100% 99%    Last Pain:  Vitals:   12/20/17 1240  TempSrc: Tympanic      Patients Stated Pain Goal: 0 (49/20/10 0712)  Complications: No apparent anesthesia complications

## 2017-12-20 NOTE — Progress Notes (Signed)
Patient's pain is better controlled at this time, but she would like to stay the night at the hospital.  Will admit to observation. Adrian Prows MD 12/20/17 5:56 PM

## 2017-12-20 NOTE — Op Note (Signed)
Operative Report:  PRE-OP DIAGNOSIS: FIBROID UTERUS   POST-OP DIAGNOSIS: FIBROID UTERUS   PROCEDURE: Procedure(s): TOTAL LAPAROSCOPIC HYSTERECTOMY WITH SALPINGECTOMY, CYSTOSCOPY  SURGEON: Adrian Prows, MD  ASSISTANT: Malachy Mood, MD   ANESTHESIA: General endotracheal anesthesia  ESTIMATED BLOOD LOSS: 300   SPECIMENS: Uterus, Tubes.  COMPLICATIONS: None  DISPOSITION: stable to PACU  FINDINGS: Intraabdominal adhesions were not noted. Normal uterus, normal bilateral ovaries. Normal fallopian tubes s/p tubal ligation.  PROCEDURE:  The patient was taken to the OR where anesthesia was administed. She was prepped and draped in the normal sterile fashion in the dorsal lithotomy position in the Dovray stirrups. A time out was performed. A weighted speculum was inserted, the cervix was grasped with a single tooth tenaculum and the endometrial cavity was sounded to 9cm. The cervix was progressively dilated to a size 18 [place] with [employer] dilators. Two 0-vicryl sutures were placed on either side of the cervix. A V-Care uterine manipulator was inserted in the usual fashion without incident. The v-care manipulator was tied to the cervical sutures. Gloves were changed and attention was turned to the abdomen. An infraumbilical transverse 75mm skin incision was made with the scalpel after local anesthesia applied to the skin. The abdomen was grasped and elevated. The trocar was placed into the abdominal cavity. A pneumoperitoneum was obtained by insufflation of CO2 (opening pressure of 75mmHg) to 24mmHg. A diagnostic laparoscopy was performed yielding the previously described findings. Attention was turned to the left lower quadrant where after visualization of the inferior epigastric vessels a 71mm skin incision was made with the scalpel. A 5 mm laparoscopic port was inserted. The same procedure was repeated in the right lower quadrant with a 82mm trocar.Diagnostic laparoscopy took place. Patient  was positioned into Trendelenburg. Normal liver, normal falciform ligament. Normal appendix. Normal ovaries. Normal fallopian tubes s/p tubal ligation.  Attention was turned to the right fallopian tube which was elevated. The Ligasure was used to cut and coagulate the mesosalpinx to preserve the ovary.  After visualization of the ureter, the round ligament was coagulated and transected using the Ligasure. The anterior and posterior leafs of the broad ligament were dissected off as the anterior one was coagulated and transected in a caudal direction towards the cuff of the uterine manipulator.   The uterine-ovarian ligament and its blood vessels were carefully coagulated and transected using the Ligasure.  Attention was turned to the left aspect of the uterus where the same procedure was performed.  The vesicouterine reflection of the peritoneum was dissected with the ligasure and the bladder flap was created bluntly.  The uterine vessels were coagulated and transected bilaterally using the ligasure. A 360 degree, circumferential colpotomy was done to completely amputate the uterus with cervix and tubes. Once the specimen was amputated it was delivered through the vagina. The colpotomy was repaired in a simple interrupted fashion using a V-lock suture with an endo-stitch device.  Vaginal exam confirms complete closure.  The cavity was copiously irrigated. There was a small amount of bleeding from the cuff in the right lateral aspect. Arista and pressure did not stop the bleeding. Coagulation was attempted. A stitch was then placed with the v-lock suture. Excellent hemostasis was obtained after the stitch was placed.  A survey of the pelvic cavity revealed adequate hemostasis and no injury to bowel, bladder, or ureter. At this point the procedure was finalized. All the instruments were removed from the patient's body. Gas was expelled and patient is leveled.  Incisions are closed with  skin adhesive.  A diagnostic  cystoscopy was performed using saline distension of bladder with no lesions or injuries noted.  Bilateral urine flow from each ureteral orifice is visualized. Vaginal cuff was palpated and was intact.  Patient goes to recovery room in stable condition.  All sponge, instrument, and needle counts are correct x2.    Adrian Prows, MD [facility], [facility] 12/20/17 4:44 PM

## 2017-12-20 NOTE — Anesthesia Procedure Notes (Signed)
Procedure Name: Intubation Date/Time: 12/20/2017 1:32 PM Performed by: Jonna Clark, CRNA Pre-anesthesia Checklist: Patient identified, Patient being monitored, Timeout performed, Emergency Drugs available and Suction available Patient Re-evaluated:Patient Re-evaluated prior to induction Oxygen Delivery Method: Circle system utilized Preoxygenation: Pre-oxygenation with 100% oxygen Induction Type: IV induction Ventilation: Mask ventilation without difficulty Laryngoscope Size: Mac and 3 Grade View: Grade I Tube type: Oral Tube size: 7.0 mm Number of attempts: 1 Placement Confirmation: ETT inserted through vocal cords under direct vision,  positive ETCO2 and breath sounds checked- equal and bilateral Secured at: 21 cm Tube secured with: Tape Dental Injury: Teeth and Oropharynx as per pre-operative assessment

## 2017-12-20 NOTE — Interval H&P Note (Signed)
History and Physical Interval Note:  12/20/2017 12:36 PM  Katie Villa  has presented today for surgery, with the diagnosis of FIBROID UTERUS  The various methods of treatment have been discussed with the patient and family. After consideration of risks, benefits and other options for treatment, the patient has consented to  Procedure(s): TOTAL LAPAROSCOPIC HYSTERECTOMY WITH SALPINGECTOMY, CYSTOSCOPY (N/A) as a surgical intervention .  The patient's history has been reviewed, patient examined, no change in status, stable for surgery.  I have reviewed the patient's chart and labs.  Questions were answered to the patient's satisfaction.     [place]

## 2017-12-20 NOTE — Discharge Instructions (Signed)
Laparoscopically Assisted Vaginal Hysterectomy, Care After °Refer to this sheet in the next few weeks. These instructions provide you with information on caring for yourself after your procedure. Your health care provider may also give you more specific instructions. Your treatment has been planned according to current medical practices, but problems sometimes occur. Call your health care provider if you have any problems or questions after your procedure. °What can I expect after the procedure? °After your procedure, it is typical to have the following: °· Abdominal pain. You will be given pain medicine to control it. °· Sore throat from the breathing tube that was inserted during surgery. ° °Follow these instructions at home: °· Only take over-the-counter or prescription medicines for pain, discomfort, or fever as directed by your health care provider. °· Do not take aspirin. It can cause bleeding. °· Do not drive when taking pain medicine. °· Follow your health care provider's advice regarding diet, exercise, lifting, driving, and general activities. °· Resume your usual diet as directed and allowed. °· Get plenty of rest and sleep. °· Do not douche, use tampons, or have sexual intercourse for at least 6 weeks, or until your health care provider gives you permission. °· Change your bandages (dressings) as directed by your health care provider. °· Monitor your temperature and notify your health care provider of a fever. °· Take showers instead of baths for 2-3 weeks. °· Do not drink alcohol until your health care provider gives you permission. °· If you develop constipation, you may take a mild laxative with your health care provider's permission. Bran foods may help with constipation problems. Drinking enough fluids to keep your urine clear or pale yellow may help as well. °· Try to have someone home with you for 1-2 weeks to help around the house. °· Keep all of your follow-up appointments as directed by your  health care provider. °Contact a health care provider if: °· You have swelling, redness, or increasing pain around your incision sites. °· You have pus coming from your incision. °· You notice a bad smell coming from your incision. °· Your incision breaks open. °· You feel dizzy or lightheaded. °· You have pain or bleeding when you urinate. °· You have persistent diarrhea. °· You have persistent nausea and vomiting. °· You have abnormal vaginal discharge. °· You have a rash. °· You have any type of abnormal reaction or develop an allergy to your medicine. °· You have poor pain control with your prescribed medicine. °Get help right away if: °· You have a fever. °· You have severe abdominal pain. °· You have chest pain. °· You have shortness of breath. °· You faint. °· You have pain, swelling, or redness in your leg. °· You have heavy vaginal bleeding with blood clots. °This information is not intended to replace advice given to you by your health care provider. Make sure you discuss any questions you have with your health care provider. °Document Released: 11/29/2011 Document Revised: 05/17/2016 Document Reviewed: 06/25/2013 °Elsevier Interactive Patient Education © 2017 [employer] Inc[employer] ° °

## 2017-12-20 NOTE — Anesthesia Preprocedure Evaluation (Signed)
Anesthesia Evaluation  Patient identified by MRN, date of birth, ID band Patient awake    Reviewed: Allergy & Precautions, NPO status , Patient's Chart, lab work & pertinent test results, reviewed documented beta blocker date and time   Airway Mallampati: III  TM Distance: >3 FB     Dental  (+) Chipped   Pulmonary           Cardiovascular      Neuro/Psych  Headaches,    GI/Hepatic   Endo/Other    Renal/GU Renal disease     Musculoskeletal   Abdominal   Peds  Hematology  (+) anemia ,   Anesthesia Other Findings Cleft palate.  Reproductive/Obstetrics                             Anesthesia Physical Anesthesia Plan  ASA: III  Anesthesia Plan: General   Post-op Pain Management:    Induction: Intravenous  PONV Risk Score and Plan:   Airway Management Planned: Oral ETT  Additional Equipment:   Intra-op Plan:   Post-operative Plan:   Informed Consent: I have reviewed the patients History and Physical, chart, labs and discussed the procedure including the risks, benefits and alternatives for the proposed anesthesia with the patient or authorized representative who has indicated his/her understanding and acceptance.     Plan Discussed with: CRNA  Anesthesia Plan Comments:         Anesthesia Quick Evaluation

## 2017-12-20 NOTE — Anesthesia Postprocedure Evaluation (Signed)
Anesthesia Post Note  Patient: Katie Villa  Procedure(s) Performed: TOTAL LAPAROSCOPIC HYSTERECTOMY WITH SALPINGECTOMY, CYSTOSCOPY (N/A )  Patient location during evaluation: PACU Anesthesia Type: General Level of consciousness: awake and alert Pain management: pain level controlled Vital Signs Assessment: post-procedure vital signs reviewed and stable Respiratory status: spontaneous breathing, nonlabored ventilation, respiratory function stable and patient connected to nasal cannula oxygen Cardiovascular status: blood pressure returned to baseline and stable Postop Assessment: no apparent nausea or vomiting Anesthetic complications: no     Last Vitals:  Vitals:   12/20/17 1725 12/20/17 1730  BP: (!) 109/55   Pulse: 74 63  Resp: 12 12  Temp:    SpO2: 100% 99%    Last Pain:  Vitals:   12/20/17 1730  TempSrc:   PainSc: 6                  Precious Haws Savi Lastinger

## 2017-12-20 NOTE — Anesthesia Post-op Follow-up Note (Signed)
Anesthesia QCDR form completed.        

## 2017-12-21 ENCOUNTER — Encounter: Payer: Self-pay | Admitting: Obstetrics and Gynecology

## 2017-12-21 DIAGNOSIS — D259 Leiomyoma of uterus, unspecified: Secondary | ICD-10-CM | POA: Diagnosis not present

## 2017-12-21 LAB — CBC
HEMATOCRIT: 27.4 % — AB (ref 35.0–47.0)
HEMOGLOBIN: 8.7 g/dL — AB (ref 12.0–16.0)
MCH: 23 pg — ABNORMAL LOW (ref 26.0–34.0)
MCHC: 31.8 g/dL — ABNORMAL LOW (ref 32.0–36.0)
MCV: 72.4 fL — AB (ref 80.0–100.0)
Platelets: 252 10*3/uL (ref 150–440)
RBC: 3.79 MIL/uL — ABNORMAL LOW (ref 3.80–5.20)
RDW: 16.5 % — AB (ref 11.5–14.5)
WBC: 9.4 10*3/uL (ref 3.6–11.0)

## 2017-12-21 MED ORDER — FERROUS SULFATE 325 (65 FE) MG PO TABS: 325.0000 mg | ORAL_TABLET | Freq: Two times a day (BID) | ORAL | 1 refills | Status: DC

## 2017-12-21 NOTE — Progress Notes (Signed)
Patient discharged home. Discharge instructions, prescriptions and follow up appointment given to and reviewed with patient. Patient verbalized understanding.

## 2017-12-21 NOTE — Progress Notes (Signed)
Patient ID: Katie Rudd, female   DOB: 02/12/1981, 36 y.o.   MRN: [id number] Benign Gynecology Progress Note  Admission Date: 12/20/2017 Current Date: 12/21/2017  Katie Villa is a 36 y.o. [id number] Post operative day #1 History complicated by: Patient Active Problem List   Diagnosis Date Noted  . S/P laparoscopic hysterectomy 12/20/2017  . 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 Uterine leiomyoma 12/01/2017  . Pelvic pain 12/01/2017  . Flank pain 06/26/2015  . Cephalalgia 11/03/2013   ROS and patient/family/surgical history, located on admission H&P note dated 12/20/2017, have been reviewed, and there are no changes except as noted below  Subjective:  Patient is feeling well. Pain is currently controlled on PO medication. She is ambulating to the bathroom. She is voiding successfully. Tolerating regular diet. + Flatus.  She desires discharge home today.  Objective:   Vitals:   12/20/17 1953 12/21/17 0001 12/21/17 0445 12/21/17 0802  BP: (!) 117/59 (!) 100/54 (!) 105/55 (!) 105/51  Pulse: (!) 57 68 73 71  Resp: 18 18 18 17   Temp: 97.9 F (36.6 C) 98.5 F (36.9 C) 98.1 F (36.7 C) 97.8 F (36.6 C)  TempSrc: Oral Oral Oral Oral  SpO2: 99% 100% 100%   Weight:      Height:       Temp:  [97.8 F (36.6 C)-98.5 F (36.9 C)] 97.8 F (36.6 C) (12/29 0802) Pulse Rate:  [57-89] 71 (12/29 0802) Resp:  [10-20] 17 (12/29 0802) BP: (100-127)/(51-74) 105/51 (12/29 0802) SpO2:  [93 %-100 %] 100 % (12/29 0445) Weight:  [216 lb (98 kg)] 216 lb (98 kg) (12/28 1240) I/O last 3 completed shifts: In: 1607 [P.O.:600; I.V.:1850] Out: 1600 [Urine:1300; Blood:300] No intake/output data recorded.  Intake/Output Summary (Last 24 hours) at 12/21/2017 0850 Last data filed at 12/20/2017 2330 Gross per 24 hour  Intake 2450 ml  Output 1600 ml  Net 850 ml     Current Vital Signs 24h Vital Sign Ranges  T 97.8 F (36.6 C) Temp  Avg: 98.1 F  (36.7 C)  Min: 97.8 F (36.6 C)  Max: 98.5 F (36.9 C)  BP (!) 105/51 BP  Min: 100/54  Max: 127/58  HR 71 Pulse  Avg: 71.1  Min: 57  Max: 89  RR 17 Resp  Avg: 15.1  Min: 10  Max: 20  SaO2 100 % Not Delivered SpO2  Avg: 99.1 %  Min: 93 %  Max: 100 %           24 Hour I/O Current Shift I/O  Time Ins Outs 12/28 0701 - 12/29 0700 In: 3710 [P.O.:600; I.V.:1850] Out: 1600 [Urine:1300] No intake/output data recorded.    Physical exam: General appearance: alert, cooperative and appears stated age Abdomen: soft, non-tender; bowel sounds normal; no masses, no organomegaly GU: No gross VB Lungs: clear to auscultation bilaterally Heart: regular rate and rhythm and no MRGs Extremities: no redness or tenderness in the calves or thighs, no edema Skin: normal and no lesions Psych: appropriate  Labs:   No results for input(s): NA, K, CL, CO2, BUN, CREATININE, GLUCOSE in the last 168 hours. Recent Labs  Lab 12/18/17 1349 12/21/17 0556  WBC 4.7 9.4  HGB 10.7* 8.7*  HCT 33.0* 27.4*  PLT 297 252    No results for input(s): CALCIUM, MG, PHOS in the last 168 hours. No results for input(s): INR, APTT in the last 168 hours.    No results for input(s): ALKPHOS,  BILITOT, BILIDIR, PROT, ALT, AST in the last 168 hours.  Invalid input(s): LABALBU  Recent Labs  Lab 12/18/17 1349 12/21/17 0556  WBC 4.7 9.4  HGB 10.7* 8.7*  HCT 33.0* 27.4*  PLT 297 252   No results for input(s): NA, K, CL, CO2, BUN, CREATININE, CALCIUM, PROT, BILITOT, ALKPHOS, ALT, AST, GLUCOSE in the last 168 hours.  Invalid input(s): LABALBU  Assessment & Plan:  Will plan for discharge home today.  Motrin and Norco PRN pain Patient has stool softners at home Anemia- will start on P.O. Iron.

## 2017-12-21 NOTE — Discharge Summary (Signed)
Physician Discharge Summary   Patient ID: Katie Villa [id number] 36 y.o. [birth year]  Admit date: 12/20/2017  Discharge date and time: No discharge date for patient encounter.   Admitting Physician: Homero Fellers, MD   Discharge Physician: Adrian Prows MD  Admission Diagnoses: [place]  Discharge Diagnoses: S/P laparscopic hysterectomy  Admission Condition: good  Discharged Condition: good  Indication for Admission: Postoperative state  Hospital Course: Patient's pain was controlled. She was able to ambulate, void and tolerate a regular diet.  Consults: None  Significant Diagnostic Studies: labs: Hgb 8.7  Treatments: surgery: See operative report.  Discharge Exam: see final progress note  Disposition: 01-Home or Self Care  Patient Instructions:  Allergies as of 12/21/2017   No Known Allergies     Medication List    TAKE these medications   ferrous sulfate 325 (65 FE) MG tablet Commonly known as:  FERROUSUL Take 1 tablet (325 mg total) by mouth 2 (two) times daily.   HYDROcodone-acetaminophen 5-325 MG tablet Commonly known as:  NORCO/VICODIN Take 1 tablet by mouth every 4 (four) hours as needed for moderate pain.   ibuprofen 600 MG tablet Commonly known as:  ADVIL,MOTRIN Take 1 tablet (600 mg total) by mouth every 6 (six) hours as needed.      Activity: discussed in discharge summary Diet: regular diet Wound Care: keep wound clean and dry  Follow-up with Dr. Gilman Schmidt in 1 week.  Signed: Homero Fellers 12/21/2017 9:06 AM

## 2017-12-23 ENCOUNTER — Encounter: Payer: Self-pay | Admitting: Obstetrics and Gynecology

## 2017-12-23 ENCOUNTER — Ambulatory Visit (INDEPENDENT_AMBULATORY_CARE_PROVIDER_SITE_OTHER): Payer: 59 | Admitting: Obstetrics and Gynecology

## 2017-12-23 VITALS — BP 122/74 | HR 83 | Wt 226.0 lb

## 2017-12-23 DIAGNOSIS — Z4889 Encounter for other specified surgical aftercare: Secondary | ICD-10-CM

## 2017-12-23 NOTE — Progress Notes (Signed)
      Postoperative Follow-up Patient presents post op from [facility], BS, cystoscopy on 12/20/2017 ago for abnormal uterine bleeding and fibroids.  Subjective: Patient reports marked improvement in her preop symptoms. Eating a regular diet without difficulty. Pain is controlled without any medications.  Activity: normal activities of daily living.  Concerned that 61mm port site has opened up  Objective: Vitals:   12/23/17 1143  BP: 122/74  Pulse: 83   Gen: NAD Abdomen: Soft, non-tender, non-distended. 51mm port site with superficial wound separation, no erythema, no discharge, mild ecchymosis.  Dermabond and steri strip applied along with tegarderm  Pathology:  Still pending  Assessment: 36 y.o. s/p TLH, BS, cysto stable  Plan: Patient has done well after surgery with no apparent complications.  I have discussed the post-operative course to date, and the expected progress moving forward.  The patient understands what complications to be concerned about.  I will see the patient in routine follow up, or sooner if needed.    Activity plan:  No intercourse, no lifting  Reassured that superficial separation should still heal well  Malachy Mood 12/23/2017, 5:04 PM

## 2017-12-25 LAB — SURGICAL PATHOLOGY

## 2017-12-26 ENCOUNTER — Ambulatory Visit (INDEPENDENT_AMBULATORY_CARE_PROVIDER_SITE_OTHER): Payer: 59 | Admitting: Obstetrics and Gynecology

## 2017-12-26 ENCOUNTER — Encounter: Payer: Self-pay | Admitting: Obstetrics and Gynecology

## 2017-12-26 VITALS — BP 108/66 | HR 81 | Ht 70.0 in | Wt 223.0 lb

## 2017-12-26 DIAGNOSIS — Z90710 Acquired absence of both cervix and uterus: Secondary | ICD-10-CM

## 2017-12-26 NOTE — Progress Notes (Signed)
  Postoperative Follow-up Patient presents post op from laparoscopic assisted vaginal hysterectomy for abnormal uterine bleeding and fibroids, 1 week ago.  Subjective: Patient reports marked improvement in her preop symptoms. Eating a regular diet without difficulty. Pain is controlled without any medications.  Activity: normal activities of daily living. Patient reports vaginal sx's of None  Objective: BP 108/66   Pulse 81   Ht 5\' 10"  (1.778 m)   Wt 223 lb (101.2 kg)   LMP 11/22/2017 (Exact Date)   BMI 32.00 kg/m  Physical Exam  Constitutional: She is oriented to person, place, and time. She appears well-developed and well-nourished.  HENT:  Head: Normocephalic and atraumatic.  Cardiovascular: Normal rate and regular rhythm.  Pulmonary/Chest: Effort normal.  Abdominal: Soft. Bowel sounds are normal. She exhibits no distension and no mass. There is no tenderness. There is no rebound and no guarding. No hernia.    Neurological: She is alert and oriented to person, place, and time.  Skin: Skin is warm and dry.  Psychiatric: She has a normal mood and affect. Her behavior is normal. Judgment and thought content normal.  Vitals reviewed.   Assessment: s/p :  total laparoscopic hysterectomy with bilateral salpingectomy,stable  Plan: Patient has done well after surgery with no apparent complications.  I have discussed the post-operative course to date, and the expected progress moving forward.  The patient understands what complications to be concerned about.  I will see the patient in routine follow up, or sooner if needed.    Activity plan: No heavy lifting for 5-7 more weeks as vagina heals. Discussed risks of vaginal dehiscence.  Katonya Blecher R Jermiyah Ricotta 12/26/2017, 4:50 PM

## 2018-01-02 ENCOUNTER — Encounter: Payer: Self-pay | Admitting: Emergency Medicine

## 2018-01-02 ENCOUNTER — Emergency Department: Payer: 59

## 2018-01-02 ENCOUNTER — Emergency Department
Admission: EM | Admit: 2018-01-02 | Discharge: 2018-01-02 | Disposition: A | Discharge status: Home / Self Care | Payer: 59 | Attending: Emergency Medicine | Admitting: Emergency Medicine

## 2018-01-02 ENCOUNTER — Other Ambulatory Visit: Payer: Self-pay

## 2018-01-02 DIAGNOSIS — R079 Chest pain, unspecified: Secondary | ICD-10-CM

## 2018-01-02 LAB — CBC
HEMATOCRIT: 33.4 % — AB (ref 35.0–47.0)
Hemoglobin: 10.7 g/dL — ABNORMAL LOW (ref 12.0–16.0)
MCH: 23.2 pg — ABNORMAL LOW (ref 26.0–34.0)
MCHC: 32 g/dL (ref 32.0–36.0)
MCV: 72.3 fL — ABNORMAL LOW (ref 80.0–100.0)
PLATELETS: 280 10*3/uL (ref 150–440)
RBC: 4.61 MIL/uL (ref 3.80–5.20)
RDW: 16.9 % — AB (ref 11.5–14.5)
WBC: 7.2 10*3/uL (ref 3.6–11.0)

## 2018-01-02 LAB — BASIC METABOLIC PANEL
ANION GAP: 10 (ref 5–15)
BUN: 8 mg/dL (ref 6–20)
CALCIUM: 9.5 mg/dL (ref 8.9–10.3)
CO2: 23 mmol/L (ref 22–32)
Chloride: 104 mmol/L (ref 101–111)
Creatinine, Ser: 0.79 mg/dL (ref 0.44–1.00)
GFR calc Af Amer: >60 mL/min (ref >60)
Glucose, Bld: 94 mg/dL (ref 65–99)
Potassium: 3.8 mmol/L (ref 3.5–5.1)
SODIUM: 137 mmol/L (ref 135–145)

## 2018-01-02 LAB — TROPONIN I

## 2018-01-02 LAB — POCT PREGNANCY, URINE: Preg Test, Ur: NEGATIVE

## 2018-01-02 NOTE — ED Provider Notes (Signed)
[facility] Emergency Department Provider Note ____________________________________________   I have reviewed the triage vital signs and the triage nursing note.  HISTORY  Chief Complaint Chest Pain    Historian Patient  HPI Katie Villa is a 37 y.o. female presenting for evaluation of central chest pain that started yesterday afternoon.  No particular overuse or injury.  Was fairly sharp and at times went through to her back.  Denies digestive symptoms, burping or belching.  Denies fevers.  Denies shortness of breath or coughing or trouble breathing.  Denies any pleuritic chest pain.  Denies any nausea or extension of the pain.  No history of this previously.  Symptoms are currently mild and much improved from earlier.   Past Medical History:  Diagnosis Date  . Acute cystitis with hematuria   . Anemia   . Cleft palate with cleft lip   . Gross hematuria   . Hydronephrosis   . IUFD (intrauterine fetal death)   . Pelvicaliectasis     Patient Active Problem List   Diagnosis Date Noted  . S/P laparoscopic hysterectomy 12/20/2017  . 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 Uterine leiomyoma 12/01/2017  . Pelvic pain 12/01/2017  . Flank pain 06/26/2015  . Cephalalgia 11/03/2013    Past Surgical History:  Procedure Laterality Date  . BREAST BIOPSY Right 02/04/2017   path pending  . CLEFT LIP REPAIR    . ECTOPIC PREGNANCY SURGERY    . KIDNEY SURGERY     URETER TWISTED  . LAPAROSCOPIC TUBAL LIGATION Bilateral 06/21/2015   Procedure: LAPAROSCOPIC TUBAL LIGATION;  Surgeon: Malachy Mood, MD;  Location: [facility] ORS;  Service: Gynecology;  Laterality: Bilateral;  . TOTAL LAPAROSCOPIC HYSTERECTOMY WITH SALPINGECTOMY N/A 12/20/2017   Procedure: TOTAL LAPAROSCOPIC HYSTERECTOMY WITH SALPINGECTOMY, CYSTOSCOPY;  Surgeon: Homero Fellers, MD;  Location: [facility] ORS;  Service: Gynecology;  Laterality: N/A;  . TUBAL  LIGATION      Prior to Admission medications   Medication Sig Start Date End Date Taking? Authorizing Provider  ferrous sulfate (FERROUSUL) 325 (65 FE) MG tablet Take 1 tablet (325 mg total) by mouth 2 (two) times daily. Patient not taking: Reported on 12/26/2017 12/21/17   Homero Fellers, MD  HYDROcodone-acetaminophen (NORCO/VICODIN) 5-325 MG tablet Take 1 tablet by mouth every 4 (four) hours as needed for moderate pain. Patient not taking: Reported on 12/26/2017 12/20/17 12/20/18  Homero Fellers, MD  ibuprofen (ADVIL,MOTRIN) 600 MG tablet Take 1 tablet (600 mg total) by mouth every 6 (six) hours as needed. Patient not taking: Reported on 12/26/2017 12/20/17   Homero Fellers, MD    No Known Allergies  Family History  Problem Relation Age of Onset  . Skin cancer Mother   . Breast cancer Maternal Aunt 29    Social History Social History   Tobacco Use  . Smoking status: Never Smoker  . Smokeless tobacco: Never Used  Substance Use Topics  . Alcohol use: Yes    Comment: OCC  . Drug use: No    Review of Systems  Constitutional: Negative for fever. Eyes: Negative for visual changes. ENT: Negative for sore throat. Cardiovascular: Positive for chest pain. Respiratory: Negative for shortness of breath. Gastrointestinal: Negative for abdominal pain, vomiting and diarrhea. Genitourinary: Negative for dysuria. Musculoskeletal: Negative for back pain. Skin: Negative for rash. Neurological: Negative for headache.  ____________________________________________   PHYSICAL EXAM:  VITAL SIGNS: ED Triage Vitals  Enc Vitals Group  BP 01/02/18 0958 129/75     Pulse Rate 01/02/18 0958 85     Resp 01/02/18 0958 18     Temp 01/02/18 0958 98.6 F (37 C)     Temp Source 01/02/18 0958 Oral     SpO2 01/02/18 0958 100 %     Weight 01/02/18 0959 223 lb (101.2 kg)     Height 01/02/18 0959 5\' 10"  (1.778 m)     Head Circumference --      Peak Flow --      Pain Score  01/02/18 0958 5     Pain Loc --      Pain Edu? --      Excl. in [place]? --      Constitutional: Alert and oriented. Well appearing and in no distress. HEENT   Head: Normocephalic and atraumatic.      Eyes: Conjunctivae are normal. Pupils equal and round.       Ears:         Nose: No congestion/rhinnorhea.   Mouth/Throat: Mucous membranes are moist.   Neck: No stridor. Cardiovascular/Chest: Normal rate, regular rhythm.  No murmurs, rubs, or gallops. Respiratory: Normal respiratory effort without tachypnea nor retractions. Breath sounds are clear and equal bilaterally. No wheezes/rales/rhonchi. Gastrointestinal: Soft. No distention, no guarding, no rebound. Nontender.    Genitourinary/rectal:Deferred Musculoskeletal: Nontender with normal range of motion in all extremities. No joint effusions.  No lower extremity tenderness.  No edema. Neurologic:  Normal speech and language. No gross or focal neurologic deficits are appreciated. Skin:  Skin is warm, dry and intact. No rash noted. Psychiatric: Mood and affect are normal. Speech and behavior are normal. Patient exhibits appropriate insight and judgment.   ____________________________________________  LABS (pertinent positives/negatives) I, Lisa Roca, MD the attending physician have reviewed the labs noted below.  Labs Reviewed  CBC - Abnormal; Notable for the following components:      Result Value   Hemoglobin 10.7 (*)    HCT 33.4 (*)    MCV 72.3 (*)    MCH 23.2 (*)    RDW 16.9 (*)    All other components within normal limits  BASIC METABOLIC PANEL  TROPONIN I  TROPONIN I  POC URINE PREG, ED  POCT PREGNANCY, URINE    ____________________________________________    EKG I, Lisa Roca, MD, the attending physician have personally viewed and interpreted all ECGs.  78 bpm.  Normal sinus rhythm.  Narrow QRS.  Normal axis.  Nonspecific ST and T wave ____________________________________________  RADIOLOGY All  Xrays were viewed by me.  Imaging interpreted by Radiologist, and I, Lisa Roca, MD the attending physician have reviewed the radiologist interpretation noted below.  Chest x-ray two-view:  IMPRESSION: No active cardiopulmonary disease. __________________________________________  PROCEDURES  Procedure(s) performed: None  Critical Care performed: None   ____________________________________________  ED COURSE / ASSESSMENT AND PLAN  Pertinent labs & imaging results that were available during my care of the patient were reviewed by me and considered in my medical decision making (see chart for details).    Overall well-appearing, normal vital signs.  Does not seem consistent with PE.  Sounds somewhat like it may be esophageal spasm, however on the other hand this may just be musculoskeletal.  She does also report that she is under a fair amount of stress, she is a Armed forces operational officer.  She has no cardiac risk factors herself, no family history of cardiac disease especially at a young age.  I have referred her back to her  primary care doctor as well as offered office follow-up for cardiology.  DIFFERENTIAL DIAGNOSIS: Differential diagnosis includes, but is not limited to, ACS, aortic dissection, pulmonary embolism, cardiac tamponade, pneumothorax, pneumonia, pericarditis, myocarditis, GI-related causes including esophagitis/gastritis, and musculoskeletal chest wall pain.    CONSULTATIONS: None  Patient / Family / Caregiver informed of clinical course, medical decision-making process, and agree with plan.   I discussed return precautions, follow-up instructions, and discharge instructions with patient and/or family.  Discharge Instructions : You are evaluated for chest discomfort, and although no certain cause was found, your exam and evaluation are overall reassuring in the emergency department today.  Return to the emergency room immediately for any worsening condition including new  or worsening chest pain, nausea, sweats, palpitations, dizziness, passing out, fever, or any other symptoms concerning to you.    ___________________________________________   FINAL CLINICAL IMPRESSION(S) / ED DIAGNOSES   Final diagnoses:  Nonspecific chest pain      ___________________________________________        Note: This dictation was prepared with Dragon dictation. Any transcriptional errors that result from this process are unintentional    Lisa Roca, MD 01/02/18 1502

## 2018-01-02 NOTE — Discharge Instructions (Signed)
You are evaluated for chest discomfort, and although no certain cause was found, your exam and evaluation are overall reassuring in the emergency department today.  Return to the emergency room immediately for any worsening condition including new or worsening chest pain, nausea, sweats, palpitations, dizziness, passing out, fever, or any other symptoms concerning to you.

## 2018-01-02 NOTE — ED Triage Notes (Signed)
Pt reports central CP that radiates to back that began yesterday. Pt reports associated SOB. No apparent distress noted, ambulatory to triage.

## 2018-01-02 NOTE — ED Notes (Signed)
UA Preg results = NEG

## 2018-01-02 NOTE — ED Notes (Signed)
Patient ambulatory to lobby with steady gait. NAD noted. Patient verbalized understanding of discharge instructions and followup care.

## 2018-01-03 ENCOUNTER — Telehealth: Payer: Self-pay

## 2018-01-03 NOTE — Telephone Encounter (Signed)
Lmov for patient to call and schedule appointment She was seen in ED on 01/02/17 for CP Will try again at a later time

## 2018-01-14 ENCOUNTER — Encounter: Payer: Self-pay | Admitting: Obstetrics and Gynecology

## 2018-01-15 NOTE — Telephone Encounter (Signed)
Lmov for patient to call and schedule appointment She was seen in ED on 01/02/17 for CP Will try again at a later time

## 2018-01-20 NOTE — Telephone Encounter (Signed)
Pt called back  She is coming march 5th   Nothing else needed.

## 2018-01-20 NOTE — Telephone Encounter (Signed)
Lmov for patient to call and schedule appointment She was seen in ED on 01/02/17 for CP

## 2018-01-20 NOTE — Telephone Encounter (Signed)
Mailed Letter

## 2018-01-27 ENCOUNTER — Ambulatory Visit: Payer: 59 | Admitting: Obstetrics and Gynecology

## 2018-02-03 ENCOUNTER — Ambulatory Visit: Payer: 59 | Admitting: Obstetrics and Gynecology

## 2018-02-23 NOTE — Progress Notes (Signed)
Cardiology Office Note  Date:  02/25/2018   ID:  Katie Villa, DOB 06/06/1981, MRN [id number]  PCP:  Remi Haggard, FNP   Chief Complaint  Patient presents with  . Other    ED follow up for chest pain. Patient c.o SOB and Chest pain. Meds reviewed verbally with patient.     HPI:  Katie Villa is a 37 y.o. female  with no significant cardiac history History of chronic anemia, HBG 10.7 Uterine fibroids, 3.5 x 2.8 cm Chronic chest pressure Who presents by referral from the emergency room for consultation of her chest pain/pressure  She reports having long history of chest pain, multiple episodes over the past several years Typically will present at rest, not with exertion Etiology unclear Good activity level at baseline, exercises frequently, walks on a track Unable to reproduce chest pain symptoms walking on the track  Recently at work January 01, 2018 when she developed a dull pressure in her chest, reported it was uncomfortable But this episode seemed to last for longer time Was able to get to sleep, symptoms still in the morning went to the emergency room,   ER 01/02/2018  Hospital records reviewed with the patient in detail Reported No particular overuse or injury.  Was fairly sharp and at times went through to her back.  Denies digestive symptoms, burping or belching.  Denies fevers.  Denies shortness of breath or coughing or trouble breathing.  Denies any pleuritic chest pain.  Denies any nausea or extension of the pain.  No history of this previously. 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 Cardiac enzymes negative  EKG personally reviewed by myself on todays visit Shows normal sinus rhythm rate 68 bpm no significant ST or T wave changes  Lab work reviewed sodium 138 potassium 3.9 normal LFTs creatinine 0.93 total cholesterol 161 with LDL 74 normal thyroid   PMH:    has a past medical history of Acute cystitis with hematuria, Anemia, Cleft palate with cleft lip, Family history of breast cancer, Gross hematuria, Hydronephrosis, IUFD (intrauterine fetal death), and Pelvicaliectasis.  PSH:    Past Surgical History:  Procedure Laterality Date  . BREAST BIOPSY Right 02/04/2017   path pending  . CLEFT LIP REPAIR    . ECTOPIC PREGNANCY SURGERY    . KIDNEY SURGERY     URETER TWISTED  . LAPAROSCOPIC TUBAL LIGATION Bilateral 06/21/2015   Procedure: LAPAROSCOPIC TUBAL LIGATION;  Surgeon: Malachy Mood, MD;  Location: [facility] ORS;  Service: Gynecology;  Laterality: Bilateral;  . TOTAL LAPAROSCOPIC HYSTERECTOMY WITH SALPINGECTOMY N/A 12/20/2017   Procedure: TOTAL LAPAROSCOPIC HYSTERECTOMY WITH SALPINGECTOMY, CYSTOSCOPY;  Surgeon: Homero Fellers, MD;  Location: [facility] ORS;  Service: Gynecology;  Laterality: N/A;  . TUBAL LIGATION      No current outpatient medications on file.   No current facility-administered medications for this visit.      Allergies:   Patient has no known allergies.   Social History:  The patient  reports that  has never smoked. she has never used smokeless tobacco. She reports that she drinks alcohol. She reports that she does not use drugs.   Family History:   family history includes Breast cancer (age of onset: 34) in her maternal aunt; Skin cancer in her mother.    Review of Systems: Review of Systems  Constitutional: Negative.   Respiratory: Positive for shortness of breath.   Cardiovascular: Negative.  Chest pressure  Gastrointestinal: Negative.   Musculoskeletal: Negative.   Neurological: Negative.   Psychiatric/Behavioral: Negative.   All other systems reviewed and are negative.    PHYSICAL EXAM: VS:  BP 102/60 (BP Location: Left Arm, Patient Position: Sitting, Cuff Size: Normal)   Pulse 68   Ht 5\' 10"  (1.778 m)   Wt 214 lb (97.1 kg)   LMP 11/22/2017 (Exact Date)   BMI 30.71 kg/m  , BMI Body mass  index is 30.71 kg/m. GEN: Well nourished, well developed, in no acute distress  HEENT: normal  Neck: no JVD, carotid bruits, or masses Cardiac: RRR; no murmurs, rubs, or gallops,no edema  Respiratory:  clear to auscultation bilaterally, normal work of breathing GI: soft, nontender, nondistended, + BS MS: no deformity or atrophy  Skin: warm and dry, no rash Neuro:  Strength and sensation are intact Psych: euthymic mood, full affect    Recent Labs: 01/02/2018: BUN 8; Creatinine, Ser 0.79; Hemoglobin 10.7; Platelets 280; Potassium 3.8; Sodium 137    Lipid Panel No results found for: CHOL, HDL, LDLCALC, TRIG    Wt Readings from Last 3 Encounters:  02/25/18 214 lb (97.1 kg)  01/02/18 223 lb (101.2 kg)  12/26/17 223 lb (101.2 kg)      ASSESSMENT AND PLAN:  Chest pain, unspecified type -  Atypical chest pressure dating back several years Often at rest, not associated with her regular exercise or exertion No significant risk factors for coronary ischemia Unable to exclude GI etiology such as GERD, gastritis, hiatal hernia Discussed management of GI pathology Unable to exclude costochondritis or other etiology, musculoskeletal Suggested she could try NSAIDs If she has recurrent symptoms especially with exertion we did offer echocardiogram or stress echo.  Discussed the test in detail After long discussion, she is indicated she would like to be scheduled for stress echo  Shortness of breath Able to exercise on a regular basis Symptoms could be exacerbated from chronic anemia As a present at rest, less likely cardiac in nature though we did offer echocardiogram if symptoms get worse  Blood loss anemia Likely secondary to chronic fibroids  Disposition:   F/U as needed We will call her with the results of her stress test as above   Total encounter time more than 60 minutes  Greater than 50% was spent in counseling and coordination of care with the patient   Orders Placed  This Encounter  Procedures  . EKG 12-Lead  . ECHOCARDIOGRAM COMPLETE  . ECHOCARDIOGRAM STRESS TEST     Signed, Esmond Plants, M.D., Ph.D. 02/25/2018  [place], [place]

## 2018-02-24 ENCOUNTER — Telehealth: Payer: Self-pay

## 2018-02-25 ENCOUNTER — Ambulatory Visit (INDEPENDENT_AMBULATORY_CARE_PROVIDER_SITE_OTHER): Payer: 59 | Admitting: Cardiovascular Disease

## 2018-02-25 ENCOUNTER — Encounter: Payer: Self-pay | Admitting: Cardiovascular Disease

## 2018-02-25 VITALS — BP 102/60 | HR 68 | Ht 70.0 in | Wt 214.0 lb

## 2018-02-25 DIAGNOSIS — R0602 Shortness of breath: Secondary | ICD-10-CM | POA: Insufficient documentation

## 2018-02-25 DIAGNOSIS — D251 Intramural leiomyoma of uterus: Secondary | ICD-10-CM

## 2018-02-25 DIAGNOSIS — R079 Chest pain, unspecified: Secondary | ICD-10-CM | POA: Diagnosis not present

## 2018-02-25 DIAGNOSIS — D500 Iron deficiency anemia secondary to blood loss (chronic): Secondary | ICD-10-CM | POA: Diagnosis not present

## 2018-02-25 NOTE — Patient Instructions (Addendum)
Medication Instructions:   Consider trying omeprazole (one a day for acid)  Or tums and pepcid as needed Coke/Gas-X  Costrocondritis, tender to palpation NSAIDS, IBP/aleve  If chest pain persists especially with exertion call our office  We could consider echocardiogram, even stress echo  Labwork:  No new labs needed  Follow-Up: It was a pleasure seeing you in the office today. Please call us if you have new issues that need to be addressed before your next appt.  [phone]  Your physician wants you to follow-up in:  As needed  If you need a refill on your cardiac medications before your next appointment, please call your pharmacy.  For educational health videos Log in to : [email or web address] Or : [email or web address], password : [email or web address]    Echocardiogram An echocardiogram, or echocardiography, uses sound waves (ultrasound) to produce an image of your heart. The echocardiogram is simple, painless, obtained within a short period of time, and offers valuable information to your health care provider. The images from an echocardiogram can provide information such as:  Evidence of coronary artery disease (CAD).  Heart size.  Heart muscle function.  Heart valve function.  Aneurysm detection.  Evidence of a past heart attack.  Fluid buildup around the heart.  Heart muscle thickening.  Assess heart valve function.  Tell a health care provider about:  Any allergies you have.  All medicines you are taking, including vitamins, herbs, eye drops, creams, and over-the-counter medicines.  Any problems you or family members have had with anesthetic medicines.  Any blood disorders you have.  Any surgeries you have had.  Any medical conditions you have.  Whether you are pregnant or may be pregnant. What happens before the procedure? No special preparation is needed. Eat and drink normally. What happens during the procedure?  In order to produce an image of your heart,  gel will be applied to your chest and a wand-like tool (transducer) will be moved over your chest. The gel will help transmit the sound waves from the transducer. The sound waves will harmlessly bounce off your heart to allow the heart images to be captured in real-time motion. These images will then be recorded.  You may need an IV to receive a medicine that improves the quality of the pictures. What happens after the procedure? You may return to your normal schedule including diet, activities, and medicines, unless your health care provider tells you otherwise. This information is not intended to replace advice given to you by your health care provider. Make sure you discuss any questions you have with your health care provider. Document Released: 12/07/2000 Document Revised: 07/28/2016 Document Reviewed: 08/17/2013 Elsevier Interactive Patient Education  2017 [employer].  Exercise Stress Echocardiogram An exercise stress echocardiogram is a test that checks how well your heart is working. For this test, you will walk on a treadmill to make your heart beat faster. This test uses sound waves (ultrasound) and a computer to make pictures (images) of your heart. These pictures will be taken before you exercise and after you exercise. What happens before the procedure?  Follow instructions from your doctor about what you cannot eat or drink before the test.  Do not drink or eat anything that has caffeine in it. Stop having caffeine for 24 hours before the test.  Ask your doctor about changing or stopping your normal medicines. This is important if you take diabetes medicines or blood thinners. Ask your doctor if you should take your medicines with water before the  test.  If you use an inhaler, bring it to the test.  Do not use any products that have nicotine or tobacco in them, such as cigarettes and e-cigarettes. Stop using them for 4 hours before the test. If you need help quitting, ask your  doctor.  Wear comfortable shoes and clothing. What happens during the procedure?  You will be hooked up to a TV screen. Your doctor will watch the screen to see how fast your heart beats during the test.  Before you exercise, a computer will make a picture of your heart. To do this: ? A gel will be put on your chest. ? A wand will be moved over the gel. ? Sound waves from the wand will go to the computer to make the picture.  Your will start walking on a treadmill. The treadmill will start at a slow speed. It will get faster a little bit at a time. When you walk faster, your heart will beat faster.  The treadmill will be stopped when your heart is working hard.  You will lie down right away so another picture of your heart can be taken.  The test will take 30-60 minutes. What happens after the procedure?  Your heart rate and blood pressure will be watched after the test.  If your doctor says that you can, you may: ? Eat what you usually eat. ? Do your normal activities. ? Take medicines like normal. Summary  An exercise stress echocardiogram is a test that checks how well your heart is working.  Follow instructions about what you cannot eat or drink before the test. Ask your doctor if you should take your normal medicines before the test.  Stop having caffeine for 24 hours before the test. Do not use anything with nicotine or tobacco in it for 4 hours before the test.  A computer will take a picture of your heart before you walk on a treadmill. It will take another picture when you are done walking.  Your heart rate and blood pressure will be watched after the test. This information is not intended to replace advice given to you by your health care provider. Make sure you discuss any questions you have with your health care provider. Document Released: 10/07/2009 Document Revised: 09/02/2016 Document Reviewed: 09/02/2016 Elsevier Interactive Patient Education  2017 [employer].

## 2018-02-26 ENCOUNTER — Telehealth: Payer: Self-pay | Admitting: Cardiovascular Disease

## 2018-02-26 NOTE — Telephone Encounter (Signed)
Left voicemail message to call back  

## 2018-02-27 NOTE — Telephone Encounter (Signed)
Left detailed voicemail message that echo and stress echo were mentioned as options if her symptoms persisted or worsened and that we could cancel those scheduled appointments with instructions for her to call back to discuss.

## 2018-03-03 NOTE — Telephone Encounter (Signed)
Left detailed voicemail message that we are going to cancel stress echo and echo at this time with instructions to call back if her symptoms persist or worsen and then we can reschedule.

## 2018-03-10 ENCOUNTER — Other Ambulatory Visit: Payer: 59

## 2018-03-14 ENCOUNTER — Other Ambulatory Visit: Payer: 59

## 2018-04-02 NOTE — Telephone Encounter (Signed)
Error

## 2019-11-27 ENCOUNTER — Other Ambulatory Visit: Payer: Self-pay

## 2019-11-27 ENCOUNTER — Emergency Department
Admission: EM | Admit: 2019-11-27 | Discharge: 2019-11-27 | Disposition: A | Discharge status: Home / Self Care | Payer: 59 | Attending: Emergency Medicine | Admitting: Emergency Medicine

## 2019-11-27 ENCOUNTER — Emergency Department: Payer: 59

## 2019-11-27 DIAGNOSIS — R109 Unspecified abdominal pain: Secondary | ICD-10-CM | POA: Diagnosis present

## 2019-11-27 DIAGNOSIS — R110 Nausea: Secondary | ICD-10-CM | POA: Insufficient documentation

## 2019-11-27 DIAGNOSIS — R1031 Right lower quadrant pain: Secondary | ICD-10-CM | POA: Insufficient documentation

## 2019-11-27 LAB — URINALYSIS, COMPLETE (UACMP) WITH MICROSCOPIC
Bacteria, UA: NONE SEEN
Bilirubin Urine: NEGATIVE
Glucose, UA: NEGATIVE mg/dL
Hgb urine dipstick: NEGATIVE
Ketones, ur: NEGATIVE mg/dL
Leukocytes,Ua: NEGATIVE
Nitrite: NEGATIVE
Protein, ur: NEGATIVE mg/dL
Specific Gravity, Urine: 1.015 (ref 1.005–1.030)
pH: 5 (ref 5.0–8.0)

## 2019-11-27 LAB — COMPREHENSIVE METABOLIC PANEL
ALT: 28 U/L (ref 0–44)
AST: 25 U/L (ref 15–41)
Albumin: 3.9 g/dL (ref 3.5–5.0)
Alkaline Phosphatase: 77 U/L (ref 38–126)
Anion gap: 6 (ref 5–15)
BUN: 10 mg/dL (ref 6–20)
CO2: 24 mmol/L (ref 22–32)
Calcium: 8.6 mg/dL — ABNORMAL LOW (ref 8.9–10.3)
Chloride: 108 mmol/L (ref 98–111)
Creatinine, Ser: 0.79 mg/dL (ref 0.44–1.00)
GFR calc Af Amer: >60 mL/min (ref >60)
GFR calc non Af Amer: >60 mL/min (ref >60)
Glucose, Bld: 101 mg/dL — ABNORMAL HIGH (ref 70–99)
Potassium: 4 mmol/L (ref 3.5–5.1)
Sodium: 138 mmol/L (ref 135–145)
Total Bilirubin: 0.6 mg/dL (ref 0.3–1.2)
Total Protein: 7.1 g/dL (ref 6.5–8.1)

## 2019-11-27 LAB — CBC
HCT: 40.4 % (ref 36.0–46.0)
Hemoglobin: 13.7 g/dL (ref 12.0–15.0)
MCH: 30 pg (ref 26.0–34.0)
MCHC: 33.9 g/dL (ref 30.0–36.0)
MCV: 88.4 fL (ref 80.0–100.0)
Platelets: 241 10*3/uL (ref 150–400)
RBC: 4.57 MIL/uL (ref 3.87–5.11)
RDW: 12 % (ref 11.5–15.5)
WBC: 5.3 10*3/uL (ref 4.0–10.5)
nRBC: 0 % (ref 0.0–0.2)

## 2019-11-27 LAB — POCT PREGNANCY, URINE: Preg Test, Ur: NEGATIVE

## 2019-11-27 LAB — LIPASE, BLOOD: Lipase: 21 U/L (ref 11–51)

## 2019-11-27 MED ORDER — ONDANSETRON HCL 4 MG/2ML IJ SOLN
4.0000 mg | Freq: Once | INTRAMUSCULAR | Status: AC
  Administered 2019-11-27: 06:00:00 4 mg via INTRAVENOUS
  Filled 2019-11-27: qty 2

## 2019-11-27 MED ORDER — ONDANSETRON 4 MG PO TBDP: 4.0000 mg | ORAL_TABLET | Freq: Three times a day (TID) | ORAL | 0 refills | Status: AC | PRN

## 2019-11-27 MED ORDER — KETOROLAC TROMETHAMINE 30 MG/ML IJ SOLN
15.0000 mg | Freq: Once | INTRAMUSCULAR | Status: AC
  Administered 2019-11-27: 06:00:00 15 mg via INTRAVENOUS
  Filled 2019-11-27: qty 1

## 2019-11-27 NOTE — ED Provider Notes (Signed)
[facility] Emergency Department Provider Note  ____________________________________________  Time seen: Approximately 6:26 AM  I have reviewed the triage vital signs and the nursing notes.   HISTORY  Chief Complaint Abdominal Pain   HPI Katie Villa is a 38 y.o. female the history of anemia, hydronephrosis status post ureteral surgery, hysterectomy who presents for evaluation of abdominal pain.  The pain started yesterday evening.  The pain is dull located in the right side of her abdomen and right flank, constant and nonradiating.  The pain is severe and associated with nausea.  No vomiting, no diarrhea, no vaginal bleeding, no vaginal discharge, no chest pain or shortness of breath, no cough or fever, no dysuria or hematuria, no constipation or diarrhea.  Patient is status post tubal ligation, hysterectomy, and kidney surgery.   Past Medical History:  Diagnosis Date  . Acute cystitis with hematuria   . Anemia   . Cleft palate with cleft lip   . Family history of breast cancer    cancer genetic testing letter sent 1/19  . Gross hematuria   . Hydronephrosis   . IUFD (intrauterine fetal death)   . Pelvicaliectasis     Patient Active Problem List   Diagnosis Date Noted  . Blood loss anemia 02/25/2018  . Chest pain 02/25/2018  . Shortness of breath 02/25/2018  . S/P laparoscopic hysterectomy 12/20/2017  . 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 Uterine leiomyoma 12/01/2017  . Pelvic pain 12/01/2017  . Flank pain 06/26/2015  . Cephalalgia 11/03/2013    Past Surgical History:  Procedure Laterality Date  . BREAST BIOPSY Right 02/04/2017   path pending  . CLEFT LIP REPAIR    . ECTOPIC PREGNANCY SURGERY    . KIDNEY SURGERY     URETER TWISTED  . LAPAROSCOPIC TUBAL LIGATION Bilateral 06/21/2015   Procedure: LAPAROSCOPIC TUBAL LIGATION;  Surgeon: Malachy Mood, MD;  Location: [facility] ORS;  Service:  Gynecology;  Laterality: Bilateral;  . TOTAL LAPAROSCOPIC HYSTERECTOMY WITH SALPINGECTOMY N/A 12/20/2017   Procedure: TOTAL LAPAROSCOPIC HYSTERECTOMY WITH SALPINGECTOMY, CYSTOSCOPY;  Surgeon: Homero Fellers, MD;  Location: [facility] ORS;  Service: Gynecology;  Laterality: N/A;  . TUBAL LIGATION      Prior to Admission medications   Not on File    Allergies Patient has no known allergies.  Family History  Problem Relation Age of Onset  . Skin cancer Mother   . Breast cancer Maternal Aunt 2    Social History Social History   Tobacco Use  . Smoking status: Never Smoker  . Smokeless tobacco: Never Used  Substance Use Topics  . Alcohol use: Yes    Comment: OCC  . Drug use: No    Review of Systems  Constitutional: Negative for fever. Eyes: Negative for visual changes. ENT: Negative for sore throat. Neck: No neck pain  Cardiovascular: Negative for chest pain. Respiratory: Negative for shortness of breath. Gastrointestinal: + abdominal pain and nausea. No vomiting or diarrhea. Genitourinary: Negative for dysuria. Musculoskeletal: Negative for back pain. Skin: Negative for rash. Neurological: Negative for headaches, weakness or numbness. Psych: No SI or HI  ____________________________________________   PHYSICAL EXAM:  VITAL SIGNS: ED Triage Vitals  Enc Vitals Group     BP 11/27/19 0451 107/76     Pulse Rate 11/27/19 0451 85     Resp 11/27/19 0451 20     Temp 11/27/19 0451 97.7 F (36.5 C)     Temp Source 11/27/19 0451  Oral     SpO2 11/27/19 0451 99 %     Weight 11/27/19 0449 225 lb (102.1 kg)     Height 11/27/19 0449 5\' 10"  (1.778 m)     Head Circumference --      Peak Flow --      Pain Score 11/27/19 0449 8     Pain Loc --      Pain Edu? --      Excl. in [place]? --     Constitutional: Alert and oriented. Well appearing and in no apparent distress. HEENT:      Head: Normocephalic and atraumatic.         Eyes: Conjunctivae are normal. Sclera is  non-icteric.       Mouth/Throat: Mucous membranes are moist.       Neck: Supple with no signs of meningismus. Cardiovascular: Regular rate and rhythm. No murmurs, gallops, or rubs. 2+ symmetrical distal pulses are present in all extremities. No JVD. Respiratory: Normal respiratory effort. Lungs are clear to auscultation bilaterally. No wheezes, crackles, or rhonchi.  Gastrointestinal: Soft, ttp over the RLQ, and non distended with positive bowel sounds. No rebound or guarding. Genitourinary: R CVA tenderness. Musculoskeletal: Nontender with normal range of motion in all extremities. No edema, cyanosis, or erythema of extremities. Neurologic: Normal speech and language. Face is symmetric. Moving all extremities. No gross focal neurologic deficits are appreciated. Skin: Skin is warm, dry and intact. No rash noted. Psychiatric: Mood and affect are normal. Speech and behavior are normal.  ____________________________________________   LABS (all labs ordered are listed, but only abnormal results are displayed)  Labs Reviewed  COMPREHENSIVE METABOLIC PANEL - Abnormal; Notable for the following components:      Result Value   Glucose, Bld 101 (*)    Calcium 8.6 (*)    All other components within normal limits  URINALYSIS, COMPLETE (UACMP) WITH MICROSCOPIC - Abnormal; Notable for the following components:   Color, Urine YELLOW (*)    APPearance CLEAR (*)    All other components within normal limits  CBC  LIPASE, BLOOD  POCT PREGNANCY, URINE   ____________________________________________  EKG  none  ____________________________________________  RADIOLOGY  I have personally reviewed the images performed during this visit and I agree with the Radiologist's read.   Interpretation by Radiologist:  Ct Renal Stone Study  Result Date: 11/27/2019 CLINICAL DATA:  Right-sided flank pain since yesterday. EXAM: CT ABDOMEN AND PELVIS WITHOUT CONTRAST TECHNIQUE: Multidetector CT imaging of the  abdomen and pelvis was performed following the standard protocol without IV contrast. COMPARISON:  07/07/2015 FINDINGS: Lower chest: The lung bases are clear of acute process. No pleural effusion or pulmonary lesions. The heart is normal in size. No pericardial effusion. The distal esophagus and aorta are unremarkable. Hepatobiliary: No focal hepatic lesions or intrahepatic biliary dilatation. The gallbladder is normal. No common bile duct dilatation. Pancreas: No mass, inflammation or ductal dilatation. Spleen: Normal size. There are stable exophytic cysts projecting medially off the upper aspect of the spleen. Adrenals/Urinary Tract: The adrenal glands are unremarkable and stable. No renal, ureteral or bladder calculi are identified. No worrisome renal or bladder lesions are identified without contrast. Stomach/Bowel: The stomach, duodenum, small bowel and colon are grossly normal without oral contrast. No inflammatory changes, mass lesions or obstructive findings. The appendix is normal. Vascular/Lymphatic: The aorta is normal in caliber. No atheroscerlotic calcifications. No mesenteric of retroperitoneal mass or adenopathy. Small scattered lymph nodes are noted. Reproductive: The uterus is surgically absent. Both ovaries  are still present and appear normal. Other: No pelvic mass or adenopathy. No free pelvic fluid collections. No inguinal mass or adenopathy. No abdominal wall hernia or subcutaneous lesions. Musculoskeletal: Right convex lumbar scoliosis. No acute bony findings or worrisome bone lesions. Osteitis pubis noted incidentally. IMPRESSION: 1. No acute abdominal/pelvic findings, mass lesions or adenopathy. 2. No renal, ureteral or bladder calculi or mass. Electronically Signed   By: Marijo Sanes M.D.   On: 11/27/2019 06:04     ____________________________________________   PROCEDURES  Procedure(s) performed: None Procedures Critical Care performed:  None  ____________________________________________   INITIAL IMPRESSION / ASSESSMENT AND PLAN / ED COURSE   38 y.o. female the history of anemia, hydronephrosis status post ureteral surgery, hysterectomy who presents for evaluation of abdominal pain.  Patient is well-appearing in no distress with normal vitals, abdomen is soft with right lower quadrant tenderness with no rebound or guarding, no right upper quadrant tenderness or Murphy sign, she also has mild right flank tenderness.  Differential diagnoses including UTI versus kidney stones versus pyelonephritis versus ovarian pathology versus gallbladder disease versus pancreatitis versus hydronephrosis.  Labs showing no leukocytosis, no anemia, negative pregnancy test normal CMP, UA with no hematuria or UTI.  Patient given Toradol and Zofran with significant improvement of her pain.  CT abdomen pelvis showing no acute abnormalities.  We will send patient for transvaginal ultrasound to rule out ovarian pathology   ED COURSE: Patient signed out to Dr. Charna Archer pending TVUS and reassessment.     As part of my medical decision making, I reviewed the following data within the [place] notes reviewed and incorporated, Labs reviewed , Radiograph reviewed , Notes from prior ED visits and [place] Controlled Substance Database   Please note:  Patient was evaluated in Emergency Department today for the symptoms described in the history of present illness. Patient was evaluated in the context of the global COVID-19 pandemic, which necessitated consideration that the patient might be at risk for infection with the SARS-CoV-2 virus that causes COVID-19. Institutional protocols and algorithms that pertain to the evaluation of patients at risk for COVID-19 are in a state of rapid change based on information released by regulatory bodies including the CDC and federal and state organizations. These policies and algorithms were followed during the  patient's care in the ED.  Some ED evaluations and interventions may be delayed as a result of limited staffing during the pandemic.   ____________________________________________   FINAL CLINICAL IMPRESSION(S) / ED DIAGNOSES   Final diagnoses:  RLQ abdominal pain      NEW MEDICATIONS STARTED DURING THIS VISIT:  ED Discharge Orders    None       Note:  This document was prepared using Dragon voice recognition software and may include unintentional dictation errors.    Rudene Re, MD 11/28/19 [phone]

## 2019-11-27 NOTE — ED Triage Notes (Signed)
Pt in with co right sided abd pain that started at 2200 yest, with nausea. States does radiate to back.

## 2019-11-27 NOTE — ED Notes (Signed)
Pt verbalized understanding of discharge instructions. NAD at this time. 

## 2019-11-27 NOTE — ED Provider Notes (Signed)
-----------------------------------------   7:03 AM on 11/27/2019 -----------------------------------------  Blood pressure 107/76, pulse 85, temperature 97.7 F (36.5 C), temperature source Oral, resp. rate 20, height 5\' 10"  (1.778 m), weight 102.1 kg, last menstrual period 11/22/2017, SpO2 99 %.  Assuming care from Dr. Alfred Levins.  In short, Katie Villa is a 38 y.o. female with a chief complaint of Abdominal Pain .  Refer to the original H&P for additional details.  The current plan of care is to follow-up TV US results for right sided abdominal pain and flank pain.  Ultrasound shows unremarkable right ovary, unable to visualize left ovary however patient has not had any left-sided symptoms.  She remains comfortable at this time with no significant pain.  Will prescribe Zofran and counseled patient to follow-up with her PCP, otherwise return to the ED for new or worsening symptoms.  Patient agrees with plan.    Blake Divine, MD 11/27/19 [phone]

## 2020-08-22 ENCOUNTER — Other Ambulatory Visit: Payer: Self-pay

## 2023-05-08 ENCOUNTER — Other Ambulatory Visit: Payer: Self-pay | Admitting: *Deleted

## 2023-05-08 DIAGNOSIS — M41129 Adolescent idiopathic scoliosis, site unspecified: Secondary | ICD-10-CM

## 2023-05-08 DIAGNOSIS — M5416 Radiculopathy, lumbar region: Secondary | ICD-10-CM

## 2023-05-11 ENCOUNTER — Encounter: Payer: Self-pay | Admitting: *Deleted

## 2023-05-25 ENCOUNTER — Other Ambulatory Visit: Payer: Self-pay

## 2024-07-10 ENCOUNTER — Encounter: Payer: Self-pay | Admitting: Advanced Practice Midwife
